# Patient Record
Sex: Female | Born: 1970 | Race: White | Hispanic: No | Marital: Married | Smoking: Current every day smoker
Health system: Southern US, Community
[De-identification: ages and names within clinical notes are randomized; demographics above are authoritative.]

## PROBLEM LIST (undated history)

## (undated) DIAGNOSIS — Z5189 Encounter for other specified aftercare: Secondary | ICD-10-CM

## (undated) DIAGNOSIS — D649 Anemia, unspecified: Secondary | ICD-10-CM

## (undated) DIAGNOSIS — E079 Disorder of thyroid, unspecified: Secondary | ICD-10-CM

---

## 2005-03-10 ENCOUNTER — Emergency Department: Payer: Self-pay | Admitting: Emergency Medicine

## 2005-07-10 ENCOUNTER — Emergency Department: Payer: Self-pay | Admitting: Emergency Medicine

## 2008-10-13 ENCOUNTER — Ambulatory Visit: Payer: Self-pay | Admitting: Family Medicine

## 2008-11-12 ENCOUNTER — Ambulatory Visit: Payer: Self-pay | Admitting: Urology

## 2008-11-21 ENCOUNTER — Emergency Department: Payer: Self-pay | Admitting: Emergency Medicine

## 2008-12-15 ENCOUNTER — Ambulatory Visit: Payer: Self-pay | Admitting: Obstetrics & Gynecology

## 2008-12-21 ENCOUNTER — Ambulatory Visit: Payer: Self-pay | Admitting: Obstetrics & Gynecology

## 2009-07-23 ENCOUNTER — Emergency Department: Payer: Self-pay | Admitting: Emergency Medicine

## 2010-06-17 ENCOUNTER — Emergency Department: Payer: Self-pay | Admitting: Emergency Medicine

## 2010-11-03 ENCOUNTER — Observation Stay: Payer: Self-pay | Admitting: Internal Medicine

## 2013-02-09 ENCOUNTER — Ambulatory Visit: Payer: Self-pay | Admitting: Family Medicine

## 2013-04-26 ENCOUNTER — Emergency Department: Payer: Self-pay | Admitting: Emergency Medicine

## 2013-04-26 LAB — CBC
HGB: 14.7 g/dL (ref 12.0–16.0)
MCHC: 34.9 g/dL (ref 32.0–36.0)
Platelet: 150 10*3/uL (ref 150–440)
RBC: 4.32 10*6/uL (ref 3.80–5.20)
WBC: 5.5 10*3/uL (ref 3.6–11.0)

## 2013-04-26 LAB — URINALYSIS, COMPLETE
Bacteria: NONE SEEN
Bilirubin,UR: NEGATIVE
Glucose,UR: NEGATIVE mg/dL (ref 0–75)
Ketone: NEGATIVE
Nitrite: NEGATIVE
Ph: 5 (ref 4.5–8.0)
RBC,UR: 4 /HPF (ref 0–5)
Specific Gravity: 1.017 (ref 1.003–1.030)

## 2013-04-26 LAB — COMPREHENSIVE METABOLIC PANEL
Alkaline Phosphatase: 65 U/L (ref 50–136)
Anion Gap: 7 (ref 7–16)
BUN: 15 mg/dL (ref 7–18)
Calcium, Total: 9.3 mg/dL (ref 8.5–10.1)
Co2: 27 mmol/L (ref 21–32)
Creatinine: 0.81 mg/dL (ref 0.60–1.30)
EGFR (African American): >60
EGFR (Non-African Amer.): >60
Glucose: 74 mg/dL (ref 65–99)
Osmolality: 277 (ref 275–301)
SGOT(AST): 10 U/L — ABNORMAL LOW (ref 15–37)
SGPT (ALT): 16 U/L (ref 12–78)
Total Protein: 7.4 g/dL (ref 6.4–8.2)

## 2013-04-26 LAB — TSH: Thyroid Stimulating Horm: 1.96 u[IU]/mL

## 2013-09-29 ENCOUNTER — Ambulatory Visit: Payer: Self-pay | Admitting: Family Medicine

## 2014-03-28 ENCOUNTER — Emergency Department: Payer: Self-pay | Admitting: Emergency Medicine

## 2014-03-28 LAB — COMPREHENSIVE METABOLIC PANEL
ALBUMIN: 4.4 g/dL (ref 3.4–5.0)
ANION GAP: 5 — AB (ref 7–16)
AST: 31 U/L (ref 15–37)
Alkaline Phosphatase: 70 U/L
BILIRUBIN TOTAL: 0.2 mg/dL (ref 0.2–1.0)
BUN: 15 mg/dL (ref 7–18)
CHLORIDE: 106 mmol/L (ref 98–107)
CO2: 27 mmol/L (ref 21–32)
Calcium, Total: 8.8 mg/dL (ref 8.5–10.1)
Creatinine: 0.97 mg/dL (ref 0.60–1.30)
EGFR (Non-African Amer.): >60
Glucose: 92 mg/dL (ref 65–99)
Osmolality: 276 (ref 275–301)
POTASSIUM: 3.5 mmol/L (ref 3.5–5.1)
SGPT (ALT): 19 U/L (ref 12–78)
SODIUM: 138 mmol/L (ref 136–145)
Total Protein: 8.1 g/dL (ref 6.4–8.2)

## 2014-03-28 LAB — CBC WITH DIFFERENTIAL/PLATELET
BASOS ABS: 0.1 10*3/uL (ref 0.0–0.1)
Basophil %: 0.9 %
Eosinophil #: 0.1 10*3/uL (ref 0.0–0.7)
Eosinophil %: 0.8 %
HCT: 43.7 % (ref 35.0–47.0)
HGB: 14.6 g/dL (ref 12.0–16.0)
LYMPHS ABS: 3.2 10*3/uL (ref 1.0–3.6)
Lymphocyte %: 35.7 %
MCH: 33.1 pg (ref 26.0–34.0)
MCHC: 33.3 g/dL (ref 32.0–36.0)
MCV: 99 fL (ref 80–100)
MONO ABS: 0.5 x10 3/mm (ref 0.2–0.9)
Monocyte %: 6 %
Neutrophil #: 5 10*3/uL (ref 1.4–6.5)
Neutrophil %: 56.6 %
Platelet: 169 10*3/uL (ref 150–440)
RBC: 4.41 10*6/uL (ref 3.80–5.20)
RDW: 12.7 % (ref 11.5–14.5)
WBC: 8.8 10*3/uL (ref 3.6–11.0)

## 2014-03-28 LAB — LIPASE, BLOOD: Lipase: 152 U/L (ref 73–393)

## 2014-03-31 ENCOUNTER — Emergency Department: Payer: Self-pay | Admitting: Emergency Medicine

## 2014-03-31 LAB — CBC WITH DIFFERENTIAL/PLATELET
BASOS ABS: 0.1 10*3/uL (ref 0.0–0.1)
Basophil %: 1.2 %
EOS ABS: 0.1 10*3/uL (ref 0.0–0.7)
Eosinophil %: 1.2 %
HCT: 37.9 % (ref 35.0–47.0)
HGB: 12.9 g/dL (ref 12.0–16.0)
LYMPHS ABS: 2.2 10*3/uL (ref 1.0–3.6)
LYMPHS PCT: 39.6 %
MCH: 34 pg (ref 26.0–34.0)
MCHC: 34.1 g/dL (ref 32.0–36.0)
MCV: 100 fL (ref 80–100)
MONOS PCT: 7.1 %
Monocyte #: 0.4 x10 3/mm (ref 0.2–0.9)
NEUTROS ABS: 2.8 10*3/uL (ref 1.4–6.5)
Neutrophil %: 50.9 %
Platelet: 155 10*3/uL (ref 150–440)
RBC: 3.8 10*6/uL (ref 3.80–5.20)
RDW: 12.8 % (ref 11.5–14.5)
WBC: 5.5 10*3/uL (ref 3.6–11.0)

## 2014-03-31 LAB — COMPREHENSIVE METABOLIC PANEL
ALK PHOS: 65 U/L
ALT: 17 U/L (ref 12–78)
ANION GAP: 6 — AB (ref 7–16)
Albumin: 3.9 g/dL (ref 3.4–5.0)
BUN: 11 mg/dL (ref 7–18)
Bilirubin,Total: 0.2 mg/dL (ref 0.2–1.0)
CHLORIDE: 108 mmol/L — AB (ref 98–107)
CREATININE: 0.75 mg/dL (ref 0.60–1.30)
Calcium, Total: 9 mg/dL (ref 8.5–10.1)
Co2: 25 mmol/L (ref 21–32)
EGFR (African American): >60
EGFR (Non-African Amer.): >60
Glucose: 73 mg/dL (ref 65–99)
Osmolality: 276 (ref 275–301)
POTASSIUM: 3.9 mmol/L (ref 3.5–5.1)
SGOT(AST): 16 U/L (ref 15–37)
Sodium: 139 mmol/L (ref 136–145)
Total Protein: 7.3 g/dL (ref 6.4–8.2)

## 2014-03-31 LAB — URINALYSIS, COMPLETE
BILIRUBIN, UR: NEGATIVE
Glucose,UR: NEGATIVE mg/dL (ref 0–75)
Ketone: NEGATIVE
Nitrite: NEGATIVE
Ph: 6 (ref 4.5–8.0)
Protein: 30
RBC,UR: 1496 /HPF (ref 0–5)
Specific Gravity: 1.016 (ref 1.003–1.030)
WBC UR: 22 /HPF (ref 0–5)

## 2014-03-31 LAB — LIPASE, BLOOD: Lipase: 162 U/L (ref 73–393)

## 2014-03-31 LAB — PREGNANCY, URINE: Pregnancy Test, Urine: NEGATIVE m[IU]/mL

## 2014-04-02 LAB — URINE CULTURE

## 2014-11-21 IMAGING — CR DG CHEST 2V
1 series · 2 of 2 positions shown · non-contrast
Comparison: none

REASON FOR EXAM: chest pain
COMMENTS:

PROCEDURE:     MDR - MDR CHEST PA(OR AP) AND LATERAL  - February 09, 2013 [DATE]
RESULT:     Comparison: 11/03/2010

[Series 1: pa · 0.17mm/px · 2 of 2 slices shown]
[im 1/2]
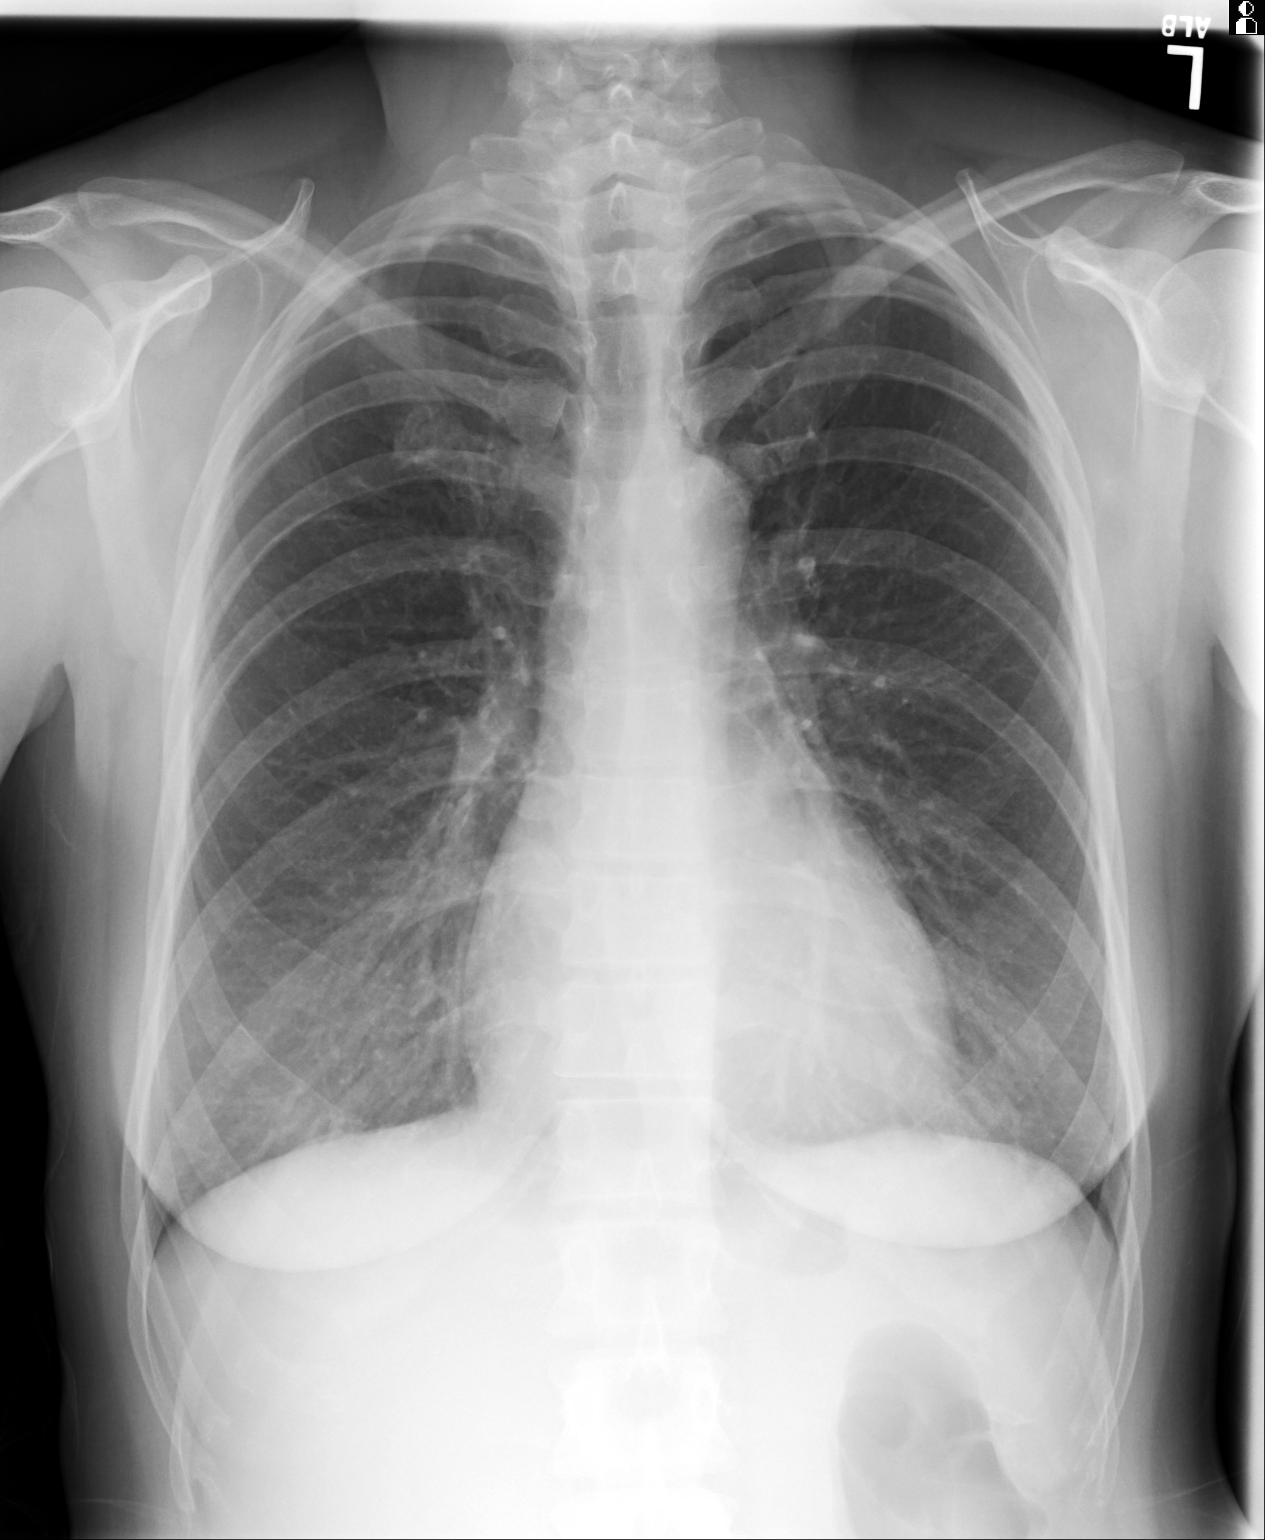
[im 2/2]
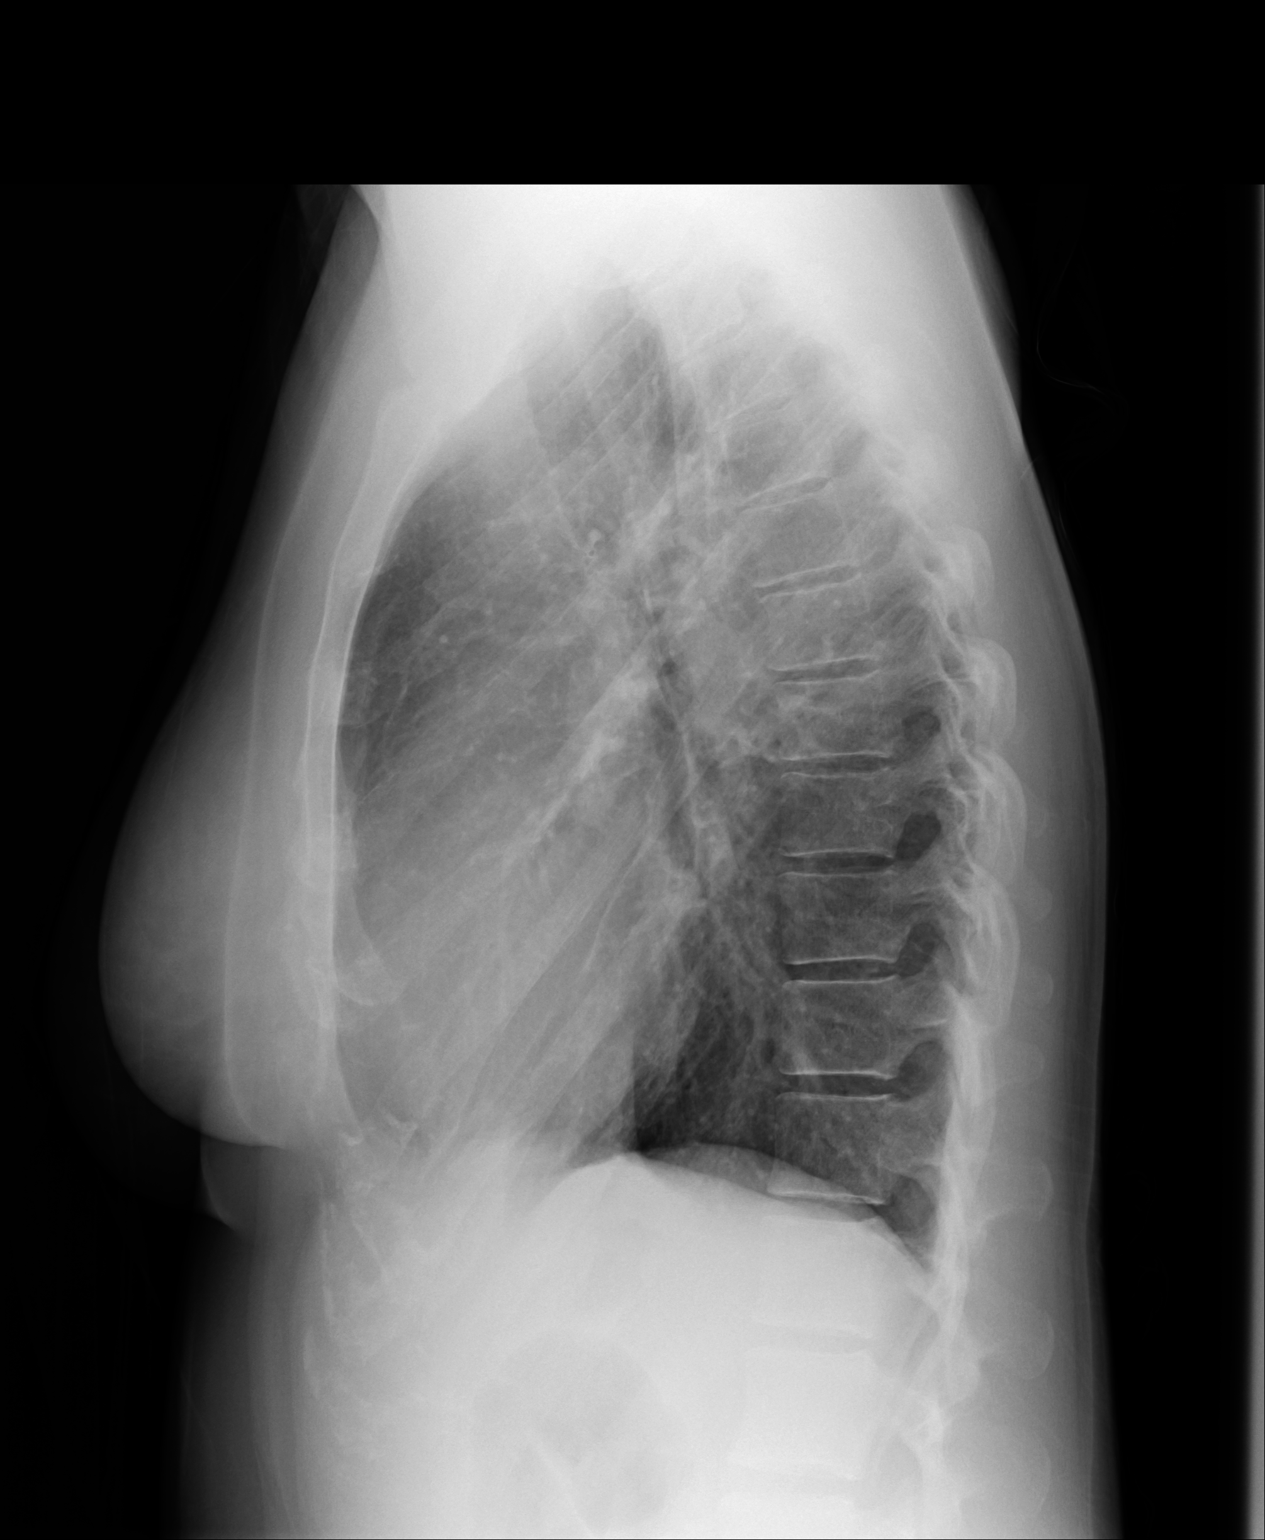

[2 of 2 positions shown; findings below may reference images not displayed]

FINDINGS: The heart and mediastinum are within normal limits. Minimal basilar
reticular opacities are likely secondary to atelectasis.
IMPRESSION: Minimal basilar reticular opacities are likely secondary to atelectasis.
Otherwise, no acute cardiopulmonary disease.

[REDACTED]

## 2014-11-21 IMAGING — CT CT STONE STUDY
1 of 2 series · 14 of 32 positions shown, 18 images · non-contrast
Comparison: none

REASON FOR EXAM: CALL REPORT [phone] Extreme back pain Chest pain
Hematuria Eval for kid...
COMMENTS:

[Series 2: soft tissue · axial · 0.68mm/px · z∈[-788,-425]mm · 14 of 138 slices shown, 18 images]
[im 11/138  soft-tissue]
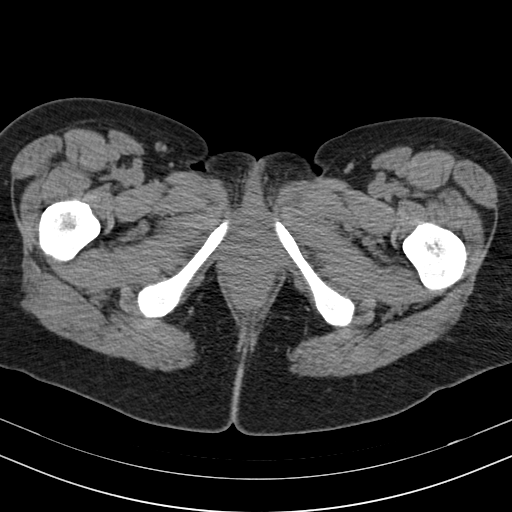
[im 11/138  bone]
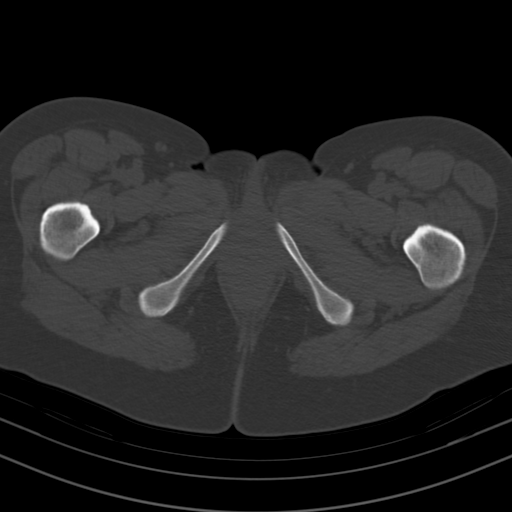
[im 22/138  soft-tissue]
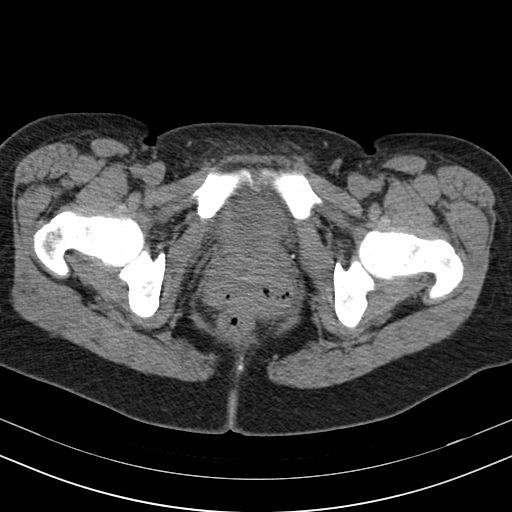
[im 32/138  soft-tissue]
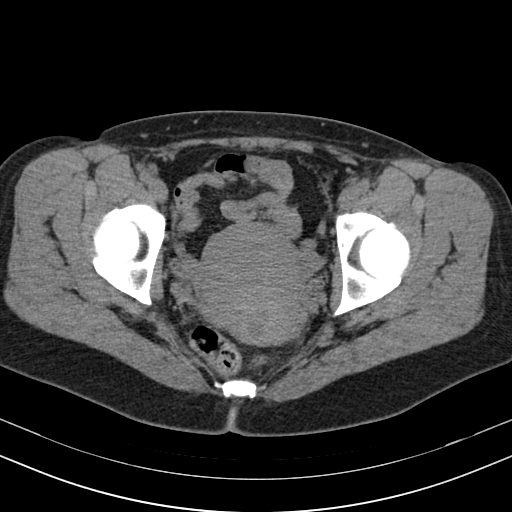
[im 43/138  soft-tissue]
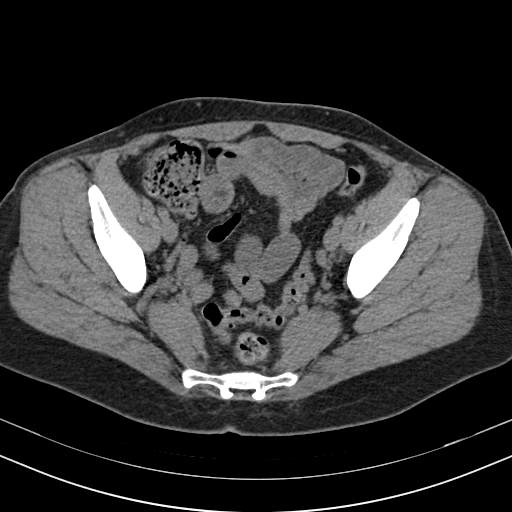
[im 53/138  soft-tissue]
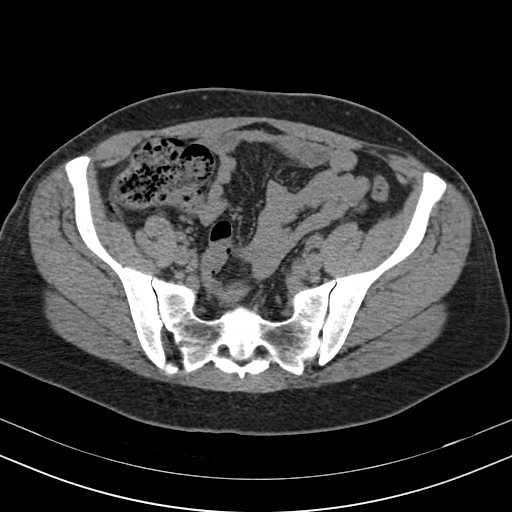
[im 64/138  soft-tissue]
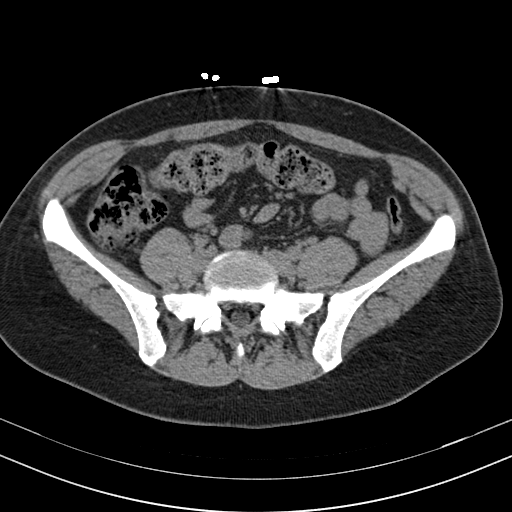
[im 74/138  soft-tissue]
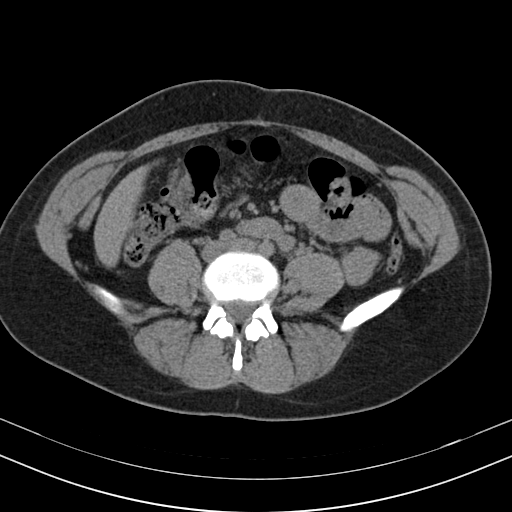
[im 85/138  soft-tissue]
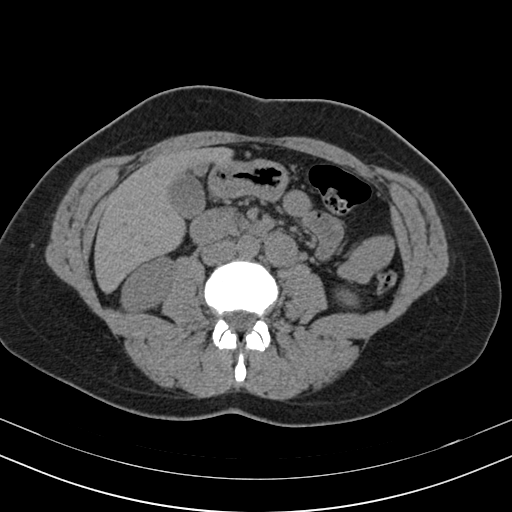
[im 95/138  soft-tissue]
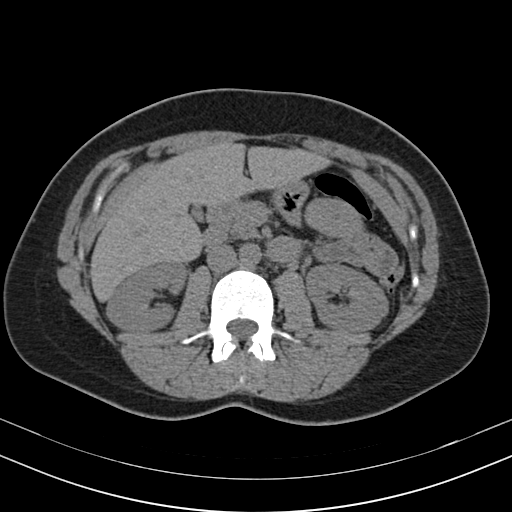
[im 95/138  bone]
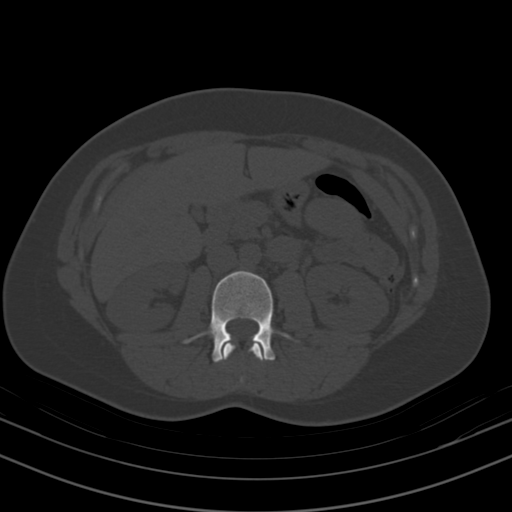
[im 106/138  soft-tissue]
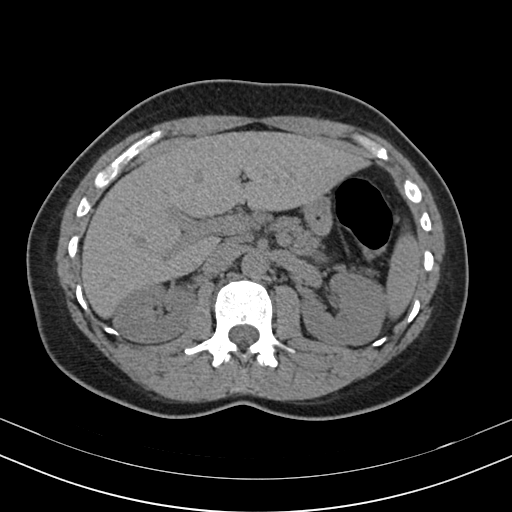
[im 116/138  soft-tissue]
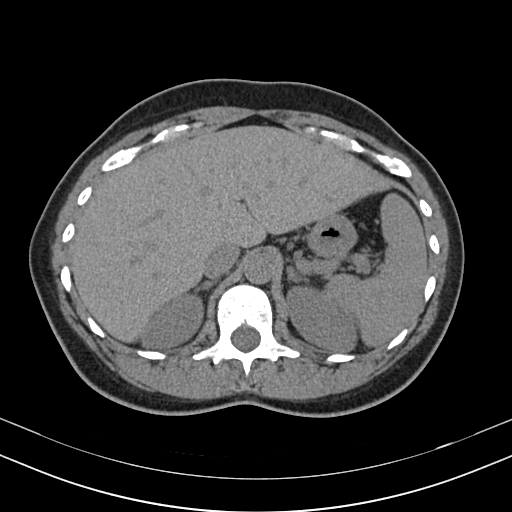
[im 116/138  lung]
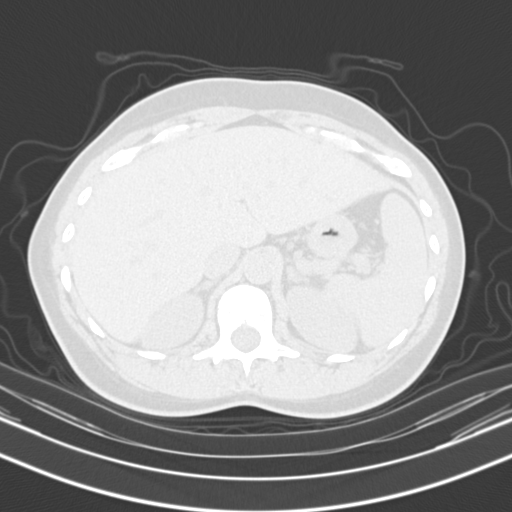
[im 122/138  lung]
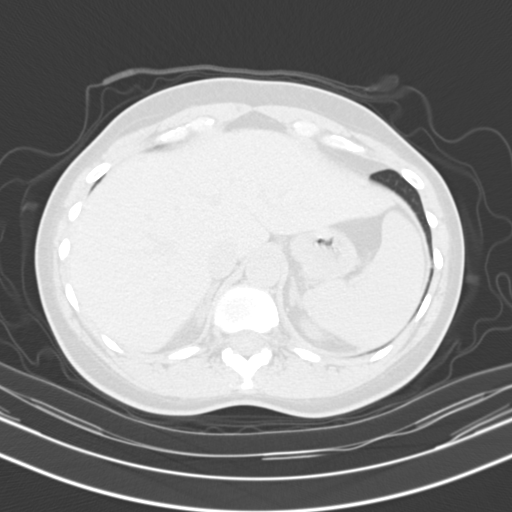
[im 127/138  soft-tissue]
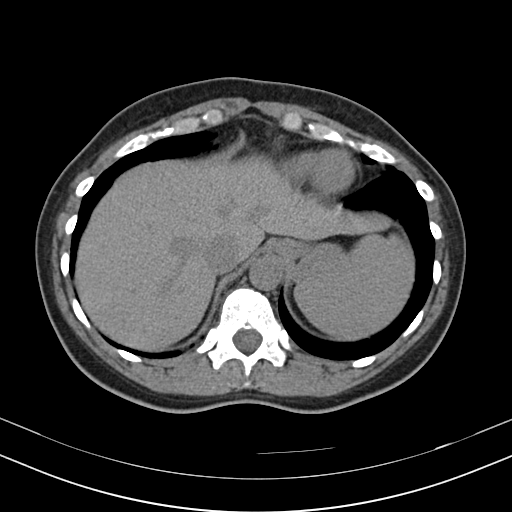
[im 127/138  lung]
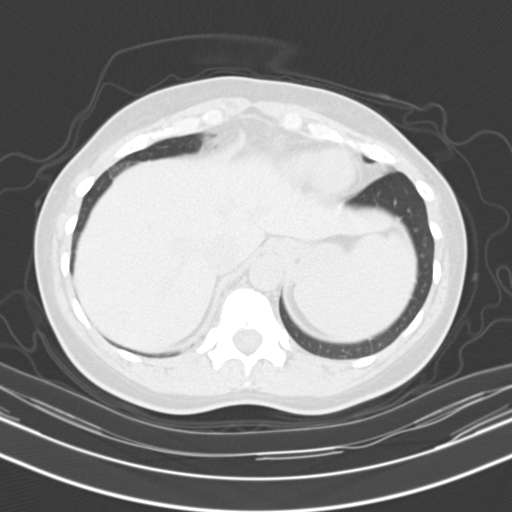
[im 132/138  lung]
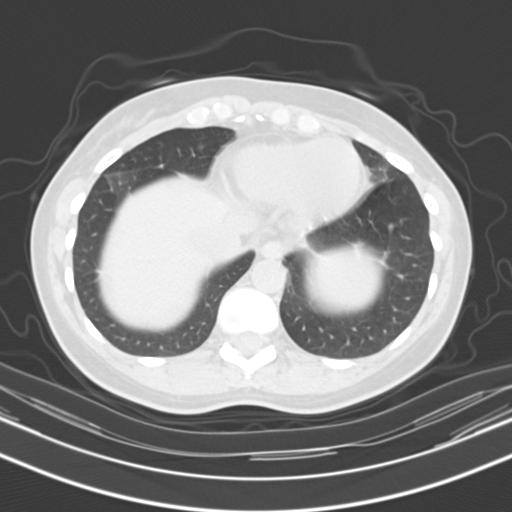

[14 of 32 positions shown; findings below may reference images not displayed]

PROCEDURE:     SNYRIT - SNYRIT ABDOMEN/PELVIS WO ( STONE)  - February 09, 2013 [DATE]

RESULT:     Axial noncontrast CT scanning was performed through the abdomen
and pelvis with reconstructions at 3 mm intervals and slice thicknesses.
Review of multiplanar reconstructed images was performed separately on the
VIA monitor.

The kidneys are normal in contour. There is no hydronephrosis. There are
faint medullary calcifications demonstrated bilaterally, but there is only
one discrete stone demonstrated. This lies in the midpole of the left kidney
on image 37 and measures 1 mm in diameter. The perinephric fat is normal in
density. Along the expected course of the ureters there is no hydroureter.
There is a 4mm calcification which may lie at the ureterovesical junction on
the left on image 116 and could reflect a nonobstructing stone. The
partially distended urinary bladder is normal in appearance.

The uterus demonstrates an approximately 12 mm diameter low density
posteriorly most compatible with a fibroid. There are no adnexal masses.
There is no free pelvic fluid. The unopacified loops of small and large
bowel exhibit no evidence of ileus or obstruction. A normal calibered
uninflamed appearing appendix is demonstrated.

The liver exhibits no focal mass or ductal dilation. The gallbladder is
adequately distended with no evidence of stones. The pancreas, spleen,
adrenal glands, nondistended stomach, and abdominal aorta are normal in
appearance. The periaortic and pericaval regions are normal in appearance.

The lung bases are clear. The lumbar vertebral bodies are preserved in
height.
IMPRESSION: 1. There is no evidence of hydronephrosis. However, there is a 4 mm diameter
calcification which may lie in the distal left ureter just proximal to the
ureterovesical junction. There is no evidence of perinephric inflammatory
change.
2. There is no acute hepatobiliary abnormality nor acute bowel abnormality.
3. There is likely a fibroid in the posterior aspect of the uterine corpus.
This report was called to Dr. Gwinn at [DATE] on 09 February, 2013.

[REDACTED]

## 2014-12-09 ENCOUNTER — Emergency Department: Payer: Self-pay | Admitting: Emergency Medicine

## 2014-12-09 LAB — BASIC METABOLIC PANEL
ANION GAP: 6 — AB (ref 7–16)
BUN: 15 mg/dL (ref 7–18)
CALCIUM: 8.4 mg/dL — AB (ref 8.5–10.1)
CO2: 27 mmol/L (ref 21–32)
CREATININE: 0.8 mg/dL (ref 0.60–1.30)
Chloride: 108 mmol/L — ABNORMAL HIGH (ref 98–107)
Glucose: 90 mg/dL (ref 65–99)
Osmolality: 282 (ref 275–301)
Potassium: 3.6 mmol/L (ref 3.5–5.1)
Sodium: 141 mmol/L (ref 136–145)

## 2014-12-09 LAB — CBC
HCT: 43.2 % (ref 35.0–47.0)
HGB: 14.8 g/dL (ref 12.0–16.0)
MCH: 33.8 pg (ref 26.0–34.0)
MCHC: 34.2 g/dL (ref 32.0–36.0)
MCV: 99 fL (ref 80–100)
PLATELETS: 132 10*3/uL — AB (ref 150–440)
RBC: 4.37 10*6/uL (ref 3.80–5.20)
RDW: 12.9 % (ref 11.5–14.5)
WBC: 6.9 10*3/uL (ref 3.6–11.0)

## 2014-12-09 LAB — TROPONIN I: Troponin-I: <0.02 ng/mL

## 2015-09-27 ENCOUNTER — Other Ambulatory Visit: Payer: Self-pay | Admitting: Family Medicine

## 2015-09-27 DIAGNOSIS — R221 Localized swelling, mass and lump, neck: Secondary | ICD-10-CM

## 2015-10-03 ENCOUNTER — Ambulatory Visit
Admission: RE | Admit: 2015-10-03 | Discharge: 2015-10-03 | Disposition: A | Discharge status: Home / Self Care | Payer: PRIVATE HEALTH INSURANCE | Source: Ambulatory Visit | Attending: Family Medicine | Admitting: Family Medicine

## 2015-10-03 DIAGNOSIS — E041 Nontoxic single thyroid nodule: Secondary | ICD-10-CM | POA: Insufficient documentation

## 2015-10-03 DIAGNOSIS — R221 Localized swelling, mass and lump, neck: Secondary | ICD-10-CM

## 2017-06-30 ENCOUNTER — Emergency Department: Payer: Self-pay

## 2017-06-30 ENCOUNTER — Encounter: Payer: Self-pay | Admitting: Emergency Medicine

## 2017-06-30 ENCOUNTER — Emergency Department
Admission: EM | Admit: 2017-06-30 | Discharge: 2017-06-30 | Disposition: A | Discharge status: Home / Self Care | Payer: Self-pay | Attending: Emergency Medicine | Admitting: Emergency Medicine

## 2017-06-30 DIAGNOSIS — Z79899 Other long term (current) drug therapy: Secondary | ICD-10-CM | POA: Insufficient documentation

## 2017-06-30 DIAGNOSIS — M79604 Pain in right leg: Secondary | ICD-10-CM

## 2017-06-30 DIAGNOSIS — M5431 Sciatica, right side: Secondary | ICD-10-CM | POA: Insufficient documentation

## 2017-06-30 DIAGNOSIS — N3000 Acute cystitis without hematuria: Secondary | ICD-10-CM | POA: Insufficient documentation

## 2017-06-30 DIAGNOSIS — F17200 Nicotine dependence, unspecified, uncomplicated: Secondary | ICD-10-CM | POA: Insufficient documentation

## 2017-06-30 HISTORY — DX: Anemia, unspecified: D64.9

## 2017-06-30 HISTORY — DX: Disorder of thyroid, unspecified: E07.9

## 2017-06-30 HISTORY — DX: Encounter for other specified aftercare: Z51.89

## 2017-06-30 LAB — URINALYSIS, ROUTINE W REFLEX MICROSCOPIC
Bacteria, UA: NONE SEEN
Bilirubin Urine: NEGATIVE
GLUCOSE, UA: NEGATIVE mg/dL
Ketones, ur: 5 mg/dL — AB
NITRITE: NEGATIVE
Protein, ur: 30 mg/dL — AB
SPECIFIC GRAVITY, URINE: 1.029 (ref 1.005–1.030)
pH: 5 (ref 5.0–8.0)

## 2017-06-30 LAB — POCT PREGNANCY, URINE: Preg Test, Ur: NEGATIVE

## 2017-06-30 MED ORDER — CYCLOBENZAPRINE HCL 5 MG PO TABS: 5.0000 mg | ORAL_TABLET | Freq: Three times a day (TID) | ORAL | 0 refills | Status: DC | PRN

## 2017-06-30 MED ORDER — TRAMADOL HCL 50 MG PO TABS: 50.0000 mg | ORAL_TABLET | Freq: Two times a day (BID) | ORAL | 0 refills | Status: DC

## 2017-06-30 MED ORDER — KETOROLAC TROMETHAMINE 60 MG/2ML IM SOLN
30.0000 mg | Freq: Once | INTRAMUSCULAR | Status: AC
  Administered 2017-06-30: 30 mg via INTRAMUSCULAR
  Filled 2017-06-30: qty 2

## 2017-06-30 MED ORDER — SULFAMETHOXAZOLE-TRIMETHOPRIM 800-160 MG PO TABS
1.0000 | ORAL_TABLET | Freq: Once | ORAL | Status: AC
  Administered 2017-06-30: 1 via ORAL
  Filled 2017-06-30: qty 1

## 2017-06-30 MED ORDER — SULFAMETHOXAZOLE-TRIMETHOPRIM 800-160 MG PO TABS: 1.0000 | ORAL_TABLET | Freq: Two times a day (BID) | ORAL | 0 refills | Status: AC

## 2017-06-30 MED ORDER — PREDNISONE 10 MG PO TABS: 10.0000 mg | ORAL_TABLET | Freq: Two times a day (BID) | ORAL | 0 refills | Status: DC

## 2017-06-30 NOTE — Discharge Instructions (Signed)
Your exam, x-ray, and ultrasound are essentially normal. No DVT (blood clot), ruptured disc, or severe degenerative changes are seen. Take the antibiotic and steroid as directed. Take the pain medicine and muscle relaxant as needed. Follow-up with Dr. Hyacinth Meeker for continued symptoms. Return to the ED as needed.

## 2017-06-30 NOTE — ED Provider Notes (Signed)
[facility] Emergency Department Provider Note ____________________________________________  Time seen: 1140  I have reviewed the triage vital signs and the nursing notes.  HISTORY  Chief Complaint  Leg Pain  HPI Jennifer Gomez is a 46 y.o. female presents to the ED for evaluation of right calf pain for the last month. She denies any recent injury, accident, or trauma. She denies any shortness of breath, chest pain, anxiety, skin temp/color changes, or foot drop. She has tried OTC medicine and ice without relief. She noted and sharp increase in the "cramp" pain to her right calf last night after she pivoted on a planted foot. She denies any bruising or disability. She has a secondary complaint of dysuria. She denies fevers, chills, sweats, or hematuria.   Past Medical History:  Diagnosis Date  . Anemia   . Blood transfusion without reported diagnosis   . Thyroid disease    There are no active problems to display for this patient.  History reviewed. No pertinent surgical history.  Prior to Admission medications   Medication Sig Start Date End Date Taking? Authorizing Provider  levothyroxine (SYNTHROID, LEVOTHROID) 50 MCG tablet Take 50 mcg by mouth daily before breakfast.   Yes [provider]  cyclobenzaprine (FLEXERIL) 5 MG tablet Take 1 tablet (5 mg total) by mouth 3 (three) times daily as needed for muscle spasms. 06/30/17   Jacci Ruberg, Charlesetta Ivory, PA-C  predniSONE (DELTASONE) 10 MG tablet Take 1 tablet (10 mg total) by mouth 2 (two) times daily with a meal. 06/30/17   Amine Adelson, Charlesetta Ivory, PA-C  sulfamethoxazole-trimethoprim (BACTRIM DS,SEPTRA DS) 800-160 MG tablet Take 1 tablet by mouth 2 (two) times daily. 06/30/17 07/05/17  Loye Vento, Charlesetta Ivory, PA-C  traMADol (ULTRAM) 50 MG tablet Take 1 tablet (50 mg total) by mouth 2 (two) times daily. 06/30/17   Matis Monnier, Charlesetta Ivory, PA-C   Allergies Patient has no known allergies.  No family history  on file.  Social History Social History  Substance Use Topics  . Smoking status: Current Every Day Smoker  . Smokeless tobacco: Never Used  . Alcohol use No    Review of Systems  Constitutional: Negative for fever. Cardiovascular: Negative for chest pain. Respiratory: Negative for shortness of breath. Gastrointestinal: Negative for abdominal pain, vomiting and diarrhea. Genitourinary: Positive for dysuria and frequency. Musculoskeletal: Negative for back pain. Right calf pain as above Skin: Negative for rash. Neurological: Negative for headaches, focal weakness or numbness. ____________________________________________  PHYSICAL EXAM:  VITAL SIGNS: ED Triage Vitals  Enc Vitals Group     BP 06/30/17 1113 136/89     Pulse Rate 06/30/17 1113 82     Resp 06/30/17 1113 16     Temp 06/30/17 1113 98.1 F (36.7 C)     Temp Source 06/30/17 1113 Oral     SpO2 06/30/17 1113 98 %     Weight 06/30/17 1115 140 lb (63.5 kg)     Height 06/30/17 1115 5\' 5"  (1.651 m)     Head Circumference --      Peak Flow --      Pain Score 06/30/17 1113 6     Pain Loc --      Pain Edu? --      Excl. in GC? --    Constitutional: Alert and oriented. Well appearing and in no distress. Head: Normocephalic and atraumatic. Cardiovascular: Normal rate, regular rhythm. Normal distal pulses. Respiratory: Normal respiratory effort. No wheezes/rales/rhonchi. Gastrointestinal: Soft and nontender. No distention,  rebound, guarding, rigidity, organomegaly. No CVA tenderness. Musculoskeletal: Normal spinal alignment without midline tenderness, spasm, deformity, or step-off. Right calf without obvious edema, erythema, muscle defect, or palpable cords. Tenderness to calf muscle compression. Negative Thompson test.Benign ankle exam. Nontender with normal range of motion in all extremities.  Neurologic:  Antalgic gait without ataxia. Normal speech and language. No gross focal neurologic deficits are appreciated. Skin:   Skin is warm, dry and intact. No rash noted. ____________________________________________   LABS (pertinent positives/negatives) Labs Reviewed  URINALYSIS, ROUTINE W REFLEX MICROSCOPIC - Abnormal; Notable for the following:       Result Value   Color, Urine AMBER (*)    APPearance CLOUDY (*)    Hgb urine dipstick SMALL (*)    Ketones, ur 5 (*)    Protein, ur 30 (*)    Leukocytes, UA LARGE (*)    Squamous Epithelial / LPF 6-30 (*)    All other components within normal limits  URINE CULTURE  POC URINE PREG, ED  POCT PREGNANCY, URINE  ____________________________________________   RADIOLOGY  RLE Ultrasound/Doppler  IMPRESSION: No evidence of DVT within the right lower extremity.  Lumbar Spine Complete  IMPRESSION: No acute abnormality noted. ____________________________________________  PROCEDURES  Bactrim DS 1 PO Toradol 30 mg IM ____________________________________________  INITIAL IMPRESSION / ASSESSMENT AND PLAN / ED COURSE  Patient with ED evaluation of RLE pain and cramping, without radiologic evidence of DVT. She is also reassured by her plain films. She is discharged with prescriptions for prednisone, cyclobenzaprine, tramadol, and Bactrim DS. She will follow-up with ortho for continued symptoms. Her symptoms may represent sciatica or muscle strain to the lower leg. She will return as needed.  ____________________________________________  FINAL CLINICAL IMPRESSION(S) / ED DIAGNOSES  Final diagnoses:  Sciatica of right side  Acute cystitis without hematuria  Right leg pain      Merritt Mccravy, Charlesetta Ivory, PA-C 06/30/17 1620    Arnaldo Natal, MD 06/30/17 1623

## 2017-06-30 NOTE — ED Notes (Addendum)
Pt ambulated to room 52 with slight limp.

## 2017-06-30 NOTE — ED Notes (Signed)
See triage note  States she is having pain to right calf area for the past month unsure of injury  States pain became worse yesterday and is moving into posterior heel  Ambulates with limp d/t pain  Also having some dysuria

## 2017-06-30 NOTE — ED Triage Notes (Signed)
Pt states that she has been having pain in her right calf for the past month. Pt states that she had a "cramp" in her calf about a month ago and has been having pain since that time. Pt states that she has tried ice and OTC medication without relief. No redness or swelling noted. Pt also states that she is having burning with urination. Pt does not appear to be in distress at this time.

## 2017-07-03 LAB — URINE CULTURE
Culture: >=100000 — AB
Special Requests: NORMAL

## 2017-11-05 DIAGNOSIS — N200 Calculus of kidney: Secondary | ICD-10-CM

## 2017-11-05 HISTORY — DX: Calculus of kidney: N20.0

## 2018-04-21 ENCOUNTER — Emergency Department
Admission: EM | Admit: 2018-04-21 | Discharge: 2018-04-21 | Disposition: A | Discharge status: Home / Self Care | Payer: PRIVATE HEALTH INSURANCE | Attending: Emergency Medicine | Admitting: Emergency Medicine

## 2018-04-21 ENCOUNTER — Encounter: Payer: Self-pay | Admitting: Emergency Medicine

## 2018-04-21 DIAGNOSIS — Z79899 Other long term (current) drug therapy: Secondary | ICD-10-CM | POA: Insufficient documentation

## 2018-04-21 DIAGNOSIS — F17200 Nicotine dependence, unspecified, uncomplicated: Secondary | ICD-10-CM | POA: Insufficient documentation

## 2018-04-21 DIAGNOSIS — J0100 Acute maxillary sinusitis, unspecified: Secondary | ICD-10-CM | POA: Insufficient documentation

## 2018-04-21 LAB — GROUP A STREP BY PCR: Group A Strep by PCR: NOT DETECTED

## 2018-04-21 MED ORDER — AMOXICILLIN-POT CLAVULANATE 875-125 MG PO TABS: 1.0000 | ORAL_TABLET | Freq: Two times a day (BID) | ORAL | 0 refills | Status: AC

## 2018-04-21 MED ORDER — PREDNISONE 10 MG PO TABS: ORAL_TABLET | ORAL | 0 refills | Status: DC

## 2018-04-21 NOTE — ED Provider Notes (Signed)
[facility] Emergency Department Provider Note  ____________________________________________   First MD Initiated Contact with Patient 04/21/18 [phone]     (approximate)  I have reviewed the triage vital signs and the nursing notes.   HISTORY  Chief Complaint Sore Throat   HPI Jennifer Gomez is a 47 y.o. female is here with complaint of sore throat and bilateral ear pain for 1 month.  Patient denies any fever or chills.  Patient has been taking over-the-counter Robitussin-DM without any relief.  She denies any fever or chills.  She states she has had sinus infections in the past and does endorse some facial pain with this.  Rates her pain as a 4 out of 10.  Past Medical History:  Diagnosis Date  . Anemia   . Blood transfusion without reported diagnosis   . Thyroid disease     There are no active problems to display for this patient.   History reviewed. No pertinent surgical history.  Prior to Admission medications   Medication Sig Start Date End Date Taking? Authorizing Provider  amoxicillin-clavulanate (AUGMENTIN) 875-125 MG tablet Take 1 tablet by mouth 2 (two) times daily for 10 days. 04/21/18 05/01/18  Tommi Rumps, PA-C  levothyroxine (SYNTHROID, LEVOTHROID) 50 MCG tablet Take 50 mcg by mouth daily before breakfast.    [provider]  predniSONE (DELTASONE) 10 MG tablet Take 6 tablets  today, on day 2 take 5 tablets, day 3 take 4 tablets, day 4 take 3 tablets, day 5 take  2 tablets and 1 tablet the last day 04/21/18   Tommi Rumps, PA-C    Allergies Patient has no known allergies.  No family history on file.  Social History Social History   Tobacco Use  . Smoking status: Current Every Day Smoker  . Smokeless tobacco: Never Used  Substance Use Topics  . Alcohol use: No  . Drug use: No    Review of Systems Constitutional: No fever/chills Eyes: No visual changes. ENT: Positive for sinus drainage. Cardiovascular:  Denies chest pain. Respiratory: Denies shortness of breath. Gastrointestinal:  No nausea, no vomiting. Musculoskeletal: Negative for back pain. Neurological: Negative for headaches. ____________________________________________   PHYSICAL EXAM:  VITAL SIGNS: ED Triage Vitals  Enc Vitals Group     BP 04/21/18 0611 (!) 151/84     Pulse Rate 04/21/18 0611 99     Resp 04/21/18 0611 18     Temp 04/21/18 0611 98.1 F (36.7 C)     Temp Source 04/21/18 0611 Oral     SpO2 04/21/18 0611 97 %     Weight 04/21/18 0608 150 lb (68 kg)     Height 04/21/18 0608 5\' 4"  (1.626 m)     Head Circumference --      Peak Flow --      Pain Score 04/21/18 0608 4     Pain Loc --      Pain Edu? --      Excl. in GC? --     Constitutional: Alert and oriented. Well appearing and in no acute distress. Eyes: Conjunctivae are normal. PERRL. EOMI. Head: Atraumatic. Nose: Mild congestion/rhinnorhea.  Maxillary sinuses tender to percussion bilaterally.  TMs are dull bilaterally with poor light reflex but no erythema or injection noted. Mouth/Throat: Mucous membranes are moist.  Oropharynx non-erythematous.  Posterior drainage present. Neck: No stridor.   Hematological/Lymphatic/Immunilogical: No cervical lymphadenopathy. Cardiovascular: Normal rate, regular rhythm. Grossly normal heart sounds.  Good peripheral circulation. Respiratory: Normal respiratory effort.  No retractions. Lungs CTAB. Musculoskeletal: Moves upper and lower extremities without any difficulty.  Normal gait was noted. Neurologic:  Normal speech and language. No gross focal neurologic deficits are appreciated. No gait instability. Skin:  Skin is warm, dry and intact. No rash noted. Psychiatric: Mood and affect are normal. Speech and behavior are normal.  ____________________________________________   LABS (all labs ordered are listed, but only abnormal results are displayed)  Labs Reviewed  GROUP A STREP BY PCR     PROCEDURES  Procedure(s) performed: None  Procedures  Critical Care performed: No  ____________________________________________   INITIAL IMPRESSION / ASSESSMENT AND PLAN / ED COURSE  As part of my medical decision making, I reviewed the following data within the electronic MEDICAL RECORD NUMBER Notes from prior ED visits and [place] Controlled Substance Database  Patient is to follow-up with Dr. Elenore RotaJuengel if any continued problems with her sinuses.  She was placed on prednisone 60 mg 6-day taper along with Augmentin 875 twice daily for 10 days.  Patient is encouraged to take Tylenol or ibuprofen if needed for facial pain.  Increase fluids.  ____________________________________________   FINAL CLINICAL IMPRESSION(S) / ED DIAGNOSES  Final diagnoses:  Acute maxillary sinusitis, recurrence not specified     ED Discharge Orders        Ordered    predniSONE (DELTASONE) 10 MG tablet     04/21/18 0736    amoxicillin-clavulanate (AUGMENTIN) 875-125 MG tablet  2 times daily     04/21/18 0736       Note:  This document was prepared using Dragon voice recognition software and may include unintentional dictation errors.    Tommi RumpsSummers, Rhonda L, PA-C 04/21/18 1120    Schaevitz, Myra Rudeavid Matthew, MD 04/21/18 [phone]

## 2018-04-21 NOTE — Discharge Instructions (Addendum)
Begin taking steroids today and for the next 6 days as directed.  Augmentin 875 twice daily for 10 days.  Tylenol or ibuprofen as needed for sinus pain.  Increase fluids.  Call make an appointment with [facility] if any continued problems or not improving.  The name of the specialist is listed on your discharge papers along with phone number for you to contact.

## 2018-04-21 NOTE — ED Notes (Signed)
See triage note  Presents with possible sinus infection  Had sinus pressure and pressure to both ears  Denies any fever  resp even and non labored

## 2018-04-21 NOTE — ED Triage Notes (Addendum)
Patient with complaint of cold symptoms with sore throat and bilateral ear pain times one month.

## 2019-11-24 ENCOUNTER — Encounter: Payer: Self-pay | Admitting: Emergency Medicine

## 2019-11-24 ENCOUNTER — Emergency Department: Payer: Self-pay

## 2019-11-24 ENCOUNTER — Other Ambulatory Visit: Payer: Self-pay

## 2019-11-24 ENCOUNTER — Emergency Department
Admission: EM | Admit: 2019-11-24 | Discharge: 2019-11-24 | Disposition: A | Discharge status: Home / Self Care | Payer: Self-pay | Attending: Emergency Medicine | Admitting: Emergency Medicine

## 2019-11-24 DIAGNOSIS — R0602 Shortness of breath: Secondary | ICD-10-CM | POA: Insufficient documentation

## 2019-11-24 DIAGNOSIS — F17200 Nicotine dependence, unspecified, uncomplicated: Secondary | ICD-10-CM | POA: Insufficient documentation

## 2019-11-24 DIAGNOSIS — Z79899 Other long term (current) drug therapy: Secondary | ICD-10-CM | POA: Insufficient documentation

## 2019-11-24 DIAGNOSIS — R079 Chest pain, unspecified: Secondary | ICD-10-CM | POA: Insufficient documentation

## 2019-11-24 DIAGNOSIS — Z20822 Contact with and (suspected) exposure to covid-19: Secondary | ICD-10-CM | POA: Insufficient documentation

## 2019-11-24 LAB — CBC WITH DIFFERENTIAL/PLATELET
Abs Immature Granulocytes: 0.01 10*3/uL (ref 0.00–0.07)
Basophils Absolute: 0.1 10*3/uL (ref 0.0–0.1)
Basophils Relative: 1 %
Eosinophils Absolute: 0.1 10*3/uL (ref 0.0–0.5)
Eosinophils Relative: 1 %
HCT: 46.8 % — ABNORMAL HIGH (ref 36.0–46.0)
Hemoglobin: 15.7 g/dL — ABNORMAL HIGH (ref 12.0–15.0)
Immature Granulocytes: 0 %
Lymphocytes Relative: 28 %
Lymphs Abs: 2 10*3/uL (ref 0.7–4.0)
MCH: 32 pg (ref 26.0–34.0)
MCHC: 33.5 g/dL (ref 30.0–36.0)
MCV: 95.5 fL (ref 80.0–100.0)
Monocytes Absolute: 0.4 10*3/uL (ref 0.1–1.0)
Monocytes Relative: 6 %
Neutro Abs: 4.6 10*3/uL (ref 1.7–7.7)
Neutrophils Relative %: 64 %
Platelets: 201 10*3/uL (ref 150–400)
RBC: 4.9 MIL/uL (ref 3.87–5.11)
RDW: 14.3 % (ref 11.5–15.5)
WBC: 7.2 10*3/uL (ref 4.0–10.5)
nRBC: 0 % (ref 0.0–0.2)

## 2019-11-24 LAB — COMPREHENSIVE METABOLIC PANEL
ALT: 14 U/L (ref 0–44)
AST: 20 U/L (ref 15–41)
Albumin: 4 g/dL (ref 3.5–5.0)
Alkaline Phosphatase: 71 U/L (ref 38–126)
Anion gap: 9 (ref 5–15)
BUN: 23 mg/dL — ABNORMAL HIGH (ref 6–20)
CO2: 27 mmol/L (ref 22–32)
Calcium: 9 mg/dL (ref 8.9–10.3)
Chloride: 105 mmol/L (ref 98–111)
Creatinine, Ser: 0.86 mg/dL (ref 0.44–1.00)
GFR calc Af Amer: >60 mL/min (ref >60)
GFR calc non Af Amer: >60 mL/min (ref >60)
Glucose, Bld: 85 mg/dL (ref 70–99)
Potassium: 3.8 mmol/L (ref 3.5–5.1)
Sodium: 141 mmol/L (ref 135–145)
Total Bilirubin: 0.5 mg/dL (ref 0.3–1.2)
Total Protein: 7.1 g/dL (ref 6.5–8.1)

## 2019-11-24 LAB — T4, FREE: Free T4: 0.48 ng/dL — ABNORMAL LOW (ref 0.61–1.12)

## 2019-11-24 LAB — RESPIRATORY PANEL BY RT PCR (FLU A&B, COVID)
Influenza A by PCR: NEGATIVE
Influenza B by PCR: NEGATIVE
SARS Coronavirus 2 by RT PCR: NEGATIVE

## 2019-11-24 LAB — POCT PREGNANCY, URINE: Preg Test, Ur: NEGATIVE

## 2019-11-24 LAB — BRAIN NATRIURETIC PEPTIDE: B Natriuretic Peptide: 80 pg/mL (ref 0.0–100.0)

## 2019-11-24 LAB — TROPONIN I (HIGH SENSITIVITY)
Troponin I (High Sensitivity): <2 ng/L (ref <18)
Troponin I (High Sensitivity): 2 ng/L (ref <18)

## 2019-11-24 LAB — TSH: TSH: 3.229 u[IU]/mL (ref 0.350–4.500)

## 2019-11-24 LAB — MAGNESIUM: Magnesium: 2.1 mg/dL (ref 1.7–2.4)

## 2019-11-24 MED ORDER — MORPHINE SULFATE (PF) 4 MG/ML IV SOLN: 4.0000 mg | Freq: Once | INTRAVENOUS | Status: DC

## 2019-11-24 MED ORDER — ASPIRIN 81 MG PO CHEW
243.0000 mg | CHEWABLE_TABLET | Freq: Once | ORAL | Status: AC
  Administered 2019-11-24: 09:00:00 243 mg via ORAL
  Filled 2019-11-24: qty 3

## 2019-11-24 MED ORDER — ONDANSETRON HCL 4 MG/2ML IJ SOLN: 4.0000 mg | Freq: Once | INTRAMUSCULAR | Status: DC

## 2019-11-24 MED ORDER — IOHEXOL 350 MG/ML SOLN
75.0000 mL | Freq: Once | INTRAVENOUS | Status: AC | PRN
  Administered 2019-11-24: 100 mL via INTRAVENOUS

## 2019-11-24 NOTE — ED Notes (Signed)
IV removed from RT [facility]  Snack given to pt to eat while waiting on RN to discharge.  Pt getting dressed.  LW EDT

## 2019-11-24 NOTE — ED Notes (Signed)
Pt c/o intermittent chest tightness to the left side of chest into the left arm with SOB , feeling like her heart is racing since Thursday. Pt is in NAD

## 2019-11-24 NOTE — Discharge Instructions (Addendum)
Your work-up is reassuring including negative CT scan of your chest and negative cardiac markers.  I did give you cardiology's number to follow-up with as needed if continue to have chest discomfort but if your chest pain comes back and gets a lot worse or there is any additional changes in it you should return to the ER for evaluation.  Your blood pressure was elevated at you will need to have this rechecked with your primary care doctor in the next week to see if you need to be started on a blood pressure medicine.

## 2019-11-24 NOTE — ED Provider Notes (Signed)
[facility] Emergency Department Provider Note  ____________________________________________   First MD Initiated Contact with Patient 11/24/19 [phone]     (approximate)  I have reviewed the triage vital signs and the nursing notes.   HISTORY  Chief Complaint Chest Pain    HPI Jennifer Gomez is a 49 y.o. female with history of hyperthyroidism who comes in for chest pain and shortness of breath.  Patient states that she felt like she had a sinus infection for the past week.  She then started to develop some chest pain last Friday, 5 days ago.  The chest pain is in the middle of her chest, radiates down her arm and into her neck, moderate, nothing makes it better, nothing makes it worse.  Feels like a pressure sensation.  This been associate with some shortness of breath is been going on a little bit longer than the chest pain.  Denies the chest pain being pleuritic.  States that she has been compliant with her thyroid medicines.  Denies any fevers or coughing.  Denies any known coronavirus contacts.  Patient states she also felt like she had some superficial swelling on the left side of her neck on one of the veins.           Past Medical History:  Diagnosis Date  . Anemia   . Blood transfusion without reported diagnosis   . Thyroid disease     There are no problems to display for this patient.   History reviewed. No pertinent surgical history.  Prior to Admission medications   Medication Sig Start Date End Date Taking? Authorizing Provider  levothyroxine (SYNTHROID, LEVOTHROID) 50 MCG tablet Take 50 mcg by mouth daily before breakfast.    [provider]  predniSONE (DELTASONE) 10 MG tablet Take 6 tablets  today, on day 2 take 5 tablets, day 3 take 4 tablets, day 4 take 3 tablets, day 5 take  2 tablets and 1 tablet the last day 04/21/18   Tommi Rumps, PA-C    Allergies Patient has no known allergies.  No family history on  file.  Social History Social History   Tobacco Use  . Smoking status: Current Every Day Smoker  . Smokeless tobacco: Never Used  Substance Use Topics  . Alcohol use: No  . Drug use: No      Review of Systems Constitutional: No fever/chills Eyes: No visual changes. ENT: No sore throat. Cardiovascular: Positive chest pain Respiratory: Positive shortness of breath Gastrointestinal: No abdominal pain.  No nausea, no vomiting.  No diarrhea.  No constipation. Genitourinary: Negative for dysuria. Musculoskeletal: Negative for back pain. Skin: Negative for rash. Neurological: Negative for headaches, focal weakness or numbness. All other ROS negative ____________________________________________   PHYSICAL EXAM:  VITAL SIGNS: ED Triage Vitals  Enc Vitals Group     BP 11/24/19 0831 (!) 214/98     Pulse Rate 11/24/19 0829 (!) 110     Resp 11/24/19 0829 18     Temp 11/24/19 0829 98 F (36.7 C)     Temp Source 11/24/19 0829 Oral     SpO2 11/24/19 0829 100 %     Weight 11/24/19 0831 140 lb (63.5 kg)     Height 11/24/19 0831 5\' 5"  (1.651 m)     Head Circumference --      Peak Flow --      Pain Score 11/24/19 0831 0     Pain Loc --      Pain Edu? --  Excl. in GC? --     Constitutional: Alert and oriented. Well appearing and in no acute distress. Eyes: Conjunctivae are normal. EOMI. Head: Atraumatic. Nose: No congestion/rhinnorhea. Mouth/Throat: Mucous membranes are moist.   Neck: No stridor. Trachea Midline. FROM.  No obvious swelling on her neck.  May be a very tiny bulge on the external jugular vein but no redness or erythema fully compressible. Cardiovascular: Tachycardic, regular rhythm. Grossly normal heart sounds.  Good peripheral circulation. Respiratory: Normal respiratory effort.  No retractions. Lungs CTAB. Gastrointestinal: Soft and nontender. No distention. No abdominal bruits.  Musculoskeletal: No lower extremity tenderness nor edema.  No joint  effusions. Neurologic:  Normal speech and language. No gross focal neurologic deficits are appreciated.  Skin:  Skin is warm, dry and intact. No rash noted. Psychiatric: Mood and affect are normal. Speech and behavior are normal. GU: Deferred   ____________________________________________   LABS (all labs ordered are listed, but only abnormal results are displayed)  Labs Reviewed  CBC WITH DIFFERENTIAL/PLATELET - Abnormal; Notable for the following components:      Result Value   Hemoglobin 15.7 (*)    HCT 46.8 (*)    All other components within normal limits  COMPREHENSIVE METABOLIC PANEL - Abnormal; Notable for the following components:   BUN 23 (*)    All other components within normal limits  T4, FREE - Abnormal; Notable for the following components:   Free T4 0.48 (*)    All other components within normal limits  RESPIRATORY PANEL BY RT PCR (FLU A&B, COVID)  BRAIN NATRIURETIC PEPTIDE  MAGNESIUM  TSH  POC URINE PREG, ED  POCT PREGNANCY, URINE  TROPONIN I (HIGH SENSITIVITY)  TROPONIN I (HIGH SENSITIVITY)   ____________________________________________   ED ECG REPORT I, Concha Se, the attending physician, personally viewed and interpreted this ECG.  EKG sinus tachycardia rate of 107, no st elevation, does have RBBB with Left posterior fasc block, no twi, normal intervals.  Looks similar to prior EKG.  ____________________________________________  RADIOLOGY   Official radiology report(s): CT Angio Chest PE W and/or Wo Contrast  Result Date: 11/24/2019 CLINICAL DATA:  Intermittent chest tightness, left-sided chest pain, shortness of breath EXAM: CT ANGIOGRAPHY CHEST WITH CONTRAST TECHNIQUE: Multidetector CT imaging of the chest was performed using the standard protocol during bolus administration of intravenous contrast. Multiplanar CT image reconstructions and MIPs were obtained to evaluate the vascular anatomy. CONTRAST:  OMNIPAQUE IOHEXOL 350 MG/ML SOLN  COMPARISON:  None. FINDINGS: Cardiovascular: Satisfactory opacification of the pulmonary arteries to the segmental level. No evidence of pulmonary embolism. Normal heart size. No pericardial effusion. Mediastinum/Nodes: No enlarged mediastinal, hilar, or axillary lymph nodes. Thyroid gland, trachea, and esophagus demonstrate no significant findings. Lungs/Pleura: Lungs are clear. No pleural effusion or pneumothorax. Upper Abdomen: No acute abnormality. Musculoskeletal: No chest wall abnormality. No acute or significant osseous findings. Review of the MIP images confirms the above findings. IMPRESSION: Negative examination for pulmonary embolism. Electronically Signed   By: Lauralyn Primes M.D.   On: 11/24/2019 11:01   DG Chest Portable 1 View  Result Date: 11/24/2019 CLINICAL DATA:  Chest pain EXAM: PORTABLE CHEST 1 VIEW COMPARISON:  02/09/2013 FINDINGS: The heart size and mediastinal contours are within normal limits. No focal airspace consolidation, pleural effusion, or pneumothorax. The visualized skeletal structures are unremarkable. IMPRESSION: No active disease. Electronically Signed   By: Duanne Guess D.O.   On: 11/24/2019 09:15    ____________________________________________   PROCEDURES  Procedure(s) performed (including Critical Care):  Procedures   ____________________________________________   INITIAL IMPRESSION / ASSESSMENT AND PLAN / ED COURSE   Jennifer Gomez was evaluated in Emergency Department on 11/24/2019 for the symptoms described in the history of present illness. She was evaluated in the context of the global COVID-19 pandemic, which necessitated consideration that the patient might be at risk for infection with the SARS-CoV-2 virus that causes COVID-19. Institutional protocols and algorithms that pertain to the evaluation of patients at risk for COVID-19 are in a state of rapid change based on information released by regulatory bodies including the CDC and federal and  state organizations. These policies and algorithms were followed during the patient's care in the ED.    Initial blood pressure was in the 200s but on repeat after patient was placed in the room it was in the 170s.  Most Likely DDx:  -MSK (atypical chest pain) but will get cardiac markers to evaluate for ACS given risk factors/age -Given patient's tachycardia and shortness of breath will get CT PE.  Consider dissection given her elevated blood pressure but on repeat it was in the 150s systolic and she denies any pain radiating into her back.  DDx that was also considered d/t potential to cause harm, but was found less likely based on history and physical (as detailed above): -PNA (no fevers, cough but CXR to evaluate) -PNX (reassured with equal b/l breath sounds, CXR to evaluate) -Symptomatic anemia (will get H&H) -Aortic Dissection as no tearing pain and no radiation to the mid back, pulses equal -Pericarditis no rub on exam, EKG changes or hx to suggest dx -Tamponade (no notable SOB, tachycardic, hypotensive) -Esophageal rupture (no h/o diffuse vomitting/no crepitus)   10:36 AM reevaluated patient updated on results thus far.  Cardiac markers negative.  Will get repeat.  No evidence of hyperthyroid or thyroid storm.  Bedside ultrasound of the external jugular did not show evidence of clot.  Discussed with patient that I  Hardly really see any swelling at this time of her neck.  Denies using a [place].  Denies dizziness or loss of consciousness I have low suspicion for dissection.  We discussed using warm compresses and to return to the ER if she develops worsening swelling, redness or pain in the neck.  CT PE is negative.  Repeat troponin is negative.  Reevaluated patient she continues to be asymptomatic.  Blood pressures are come down.  Discussed with patient getting a repeat blood pressure in the next week to see if she needs to be started on a blood pressure medicine.  Patient  expressed understanding.  Patient will follow up with her primary care doctor  I discussed the provisional nature of ED diagnosis, the treatment so far, the ongoing plan of care, follow up appointments and return precautions with the patient and any family or support people present. They expressed understanding and agreed with the plan, discharged home.   ____________________________________________   FINAL CLINICAL IMPRESSION(S) / ED DIAGNOSES   Final diagnoses:  Chest pain, unspecified type     MEDICATIONS GIVEN DURING THIS VISIT:  Medications  morphine 4 MG/ML injection 4 mg (4 mg Intravenous Refused 11/24/19 0921)  ondansetron (ZOFRAN) injection 4 mg (4 mg Intravenous Refused 11/24/19 0927)  aspirin chewable tablet 243 mg (243 mg Oral Given 11/24/19 0925)  iohexol (OMNIPAQUE) 350 MG/ML injection 75 mL (100 mLs Intravenous Contrast Given 11/24/19 1043)     ED Discharge Orders    None       Note:  This document  was prepared using Systems analyst and may include unintentional dictation errors.   Vanessa Addison, MD 11/24/19 1228

## 2019-11-24 NOTE — ED Triage Notes (Signed)
Pt states intermittent heaviness in chest since yesterday, anxious and tearful in triage. States she has issues with her thyroid as well. NAD.

## 2021-12-09 ENCOUNTER — Encounter: Payer: Self-pay | Admitting: Intensive Care

## 2021-12-09 ENCOUNTER — Emergency Department: Payer: Self-pay

## 2021-12-09 ENCOUNTER — Other Ambulatory Visit: Payer: Self-pay

## 2021-12-09 ENCOUNTER — Emergency Department
Admission: EM | Admit: 2021-12-09 | Discharge: 2021-12-09 | Disposition: A | Discharge status: Home / Self Care | Payer: Self-pay | Attending: Student in an Organized Health Care Education/Training Program | Admitting: Student in an Organized Health Care Education/Training Program

## 2021-12-09 DIAGNOSIS — R0789 Other chest pain: Secondary | ICD-10-CM

## 2021-12-09 DIAGNOSIS — E039 Hypothyroidism, unspecified: Secondary | ICD-10-CM | POA: Insufficient documentation

## 2021-12-09 DIAGNOSIS — I10 Essential (primary) hypertension: Secondary | ICD-10-CM | POA: Insufficient documentation

## 2021-12-09 LAB — BASIC METABOLIC PANEL
Anion gap: 5 (ref 5–15)
BUN: 20 mg/dL (ref 6–20)
CO2: 26 mmol/L (ref 22–32)
Calcium: 9.4 mg/dL (ref 8.9–10.3)
Chloride: 108 mmol/L (ref 98–111)
Creatinine, Ser: 0.8 mg/dL (ref 0.44–1.00)
GFR, Estimated: >60 mL/min (ref >60)
Glucose, Bld: 99 mg/dL (ref 70–99)
Potassium: 4 mmol/L (ref 3.5–5.1)
Sodium: 139 mmol/L (ref 135–145)

## 2021-12-09 LAB — CBC
HCT: 42.2 % (ref 36.0–46.0)
Hemoglobin: 13.8 g/dL (ref 12.0–15.0)
MCH: 32.4 pg (ref 26.0–34.0)
MCHC: 32.7 g/dL (ref 30.0–36.0)
MCV: 99.1 fL (ref 80.0–100.0)
Platelets: 213 10*3/uL (ref 150–400)
RBC: 4.26 MIL/uL (ref 3.87–5.11)
RDW: 13.4 % (ref 11.5–15.5)
WBC: 7.3 10*3/uL (ref 4.0–10.5)
nRBC: 0 % (ref 0.0–0.2)

## 2021-12-09 LAB — T4, FREE: Free T4: 0.79 ng/dL (ref 0.61–1.12)

## 2021-12-09 LAB — TROPONIN I (HIGH SENSITIVITY)
Troponin I (High Sensitivity): 7 ng/L (ref <18)
Troponin I (High Sensitivity): 8 ng/L (ref <18)

## 2021-12-09 LAB — D-DIMER, QUANTITATIVE: D-Dimer, Quant: <0.27 ug/mL-FEU (ref 0.00–0.50)

## 2021-12-09 LAB — TSH: TSH: 0.414 u[IU]/mL (ref 0.350–4.500)

## 2021-12-09 NOTE — ED Triage Notes (Signed)
Patient c/o chest heaviness with radiation up to left side of neck. Reports symptoms since Thanksgiving 2022 but worsened today.

## 2021-12-09 NOTE — ED Provider Notes (Signed)
[facility] Provider Note    Event Date/Time   First MD Initiated Contact with Patient 12/09/21 1559     (approximate)   History   Chest Pain   HPI  Jennifer Gomez is a 51 y.o. female with a history of hypothyroidism presents to the ER for evaluation of several months of intermittent swelling tingling discoloration of her left foot and fingertips.  States she is also having muscle cramps periodically feels like she is going downhill since Thanksgiving.  Having some intermittent chest discomfort denies any shortness of breath.  She is worried that she has a clot.  She denies any chest pain at this time no nausea or vomiting no fevers.     Physical Exam   Triage Vital Signs: ED Triage Vitals  Enc Vitals Group     BP 12/09/21 1556 (!) 211/91     Pulse Rate 12/09/21 1556 94     Resp 12/09/21 1556 18     Temp 12/09/21 1556 98.6 F (37 C)     Temp Source 12/09/21 1556 Oral     SpO2 12/09/21 1556 99 %     Weight 12/09/21 1553 137 lb 3.2 oz (62.2 kg)     Height 12/09/21 1553 5\' 4"  (1.626 m)     Head Circumference --      Peak Flow --      Pain Score 12/09/21 1553 6     Pain Loc --      Pain Edu? --      Excl. in [place]? --     Most recent vital signs: Vitals:   12/09/21 1830 12/09/21 1900  BP: (!) 180/99 (!) 149/77  Pulse: 73 66  Resp: 10 11  Temp:  98.4 F (36.9 C)  SpO2: 99% 99%     Constitutional: Alert  Eyes: Conjunctivae are normal.  Head: Atraumatic. Nose: No congestion/rhinnorhea. Mouth/Throat: Mucous membranes are moist.   Neck: Painless ROM.  Cardiovascular:   Good peripheral circulation.  Strong pt and dp pulse, normal heart sounds. Respiratory: Normal respiratory effort.  No retractions.  Gastrointestinal: Soft and nontender.  Musculoskeletal:  no deformity Neurologic:  MAE spontaneously. No gross focal neurologic deficits are appreciated.  Skin:  Skin is warm, dry and intact. No rash noted. Psychiatric: Mood and affect are  normal. Speech and behavior are normal.    ED Results / Procedures / Treatments   Labs (all labs ordered are listed, but only abnormal results are displayed) Labs Reviewed  BASIC METABOLIC PANEL  CBC  TSH  T4, FREE  D-DIMER, QUANTITATIVE (NOT AT [facility])  TROPONIN I (HIGH SENSITIVITY)  TROPONIN I (HIGH SENSITIVITY)     EKG  ED ECG REPORT I, Merlyn Lot, the attending physician, personally viewed and interpreted this ECG.   Date: 12/09/2021  EKG Time: 15:50  Rate: 90  Rhythm: sinus  Axis: right  Intervals:rbbb  ST&T Change: nonspecific st abn, no stemi, unchanged morphology as compared to 11/25/19    RADIOLOGY Please see ED Course for my review and interpretation.  I personally reviewed all radiographic images ordered to evaluate for the above acute complaints and reviewed radiology reports and findings.  These findings were personally discussed with the patient.  Please see medical record for radiology report.    PROCEDURES:  Critical Care performed: No  Procedures   MEDICATIONS ORDERED IN ED: Medications - No data to display   IMPRESSION / MDM / [place] / ED COURSE  I reviewed the  triage vital signs and the nursing notes.                              Differential diagnosis includes, but is not limited to, ACS, pericarditis, pe, dissection, pna, bronchitis, costochondritis  Patient presenting with symptoms as described above.  Clinically nontoxic-appearing somewhat anxious.  EKG is unchanged compared to previous.  Blood will be sent for the blood differential.  She is well-perfused throughout.  Have a low suspicion for PE or dissection.  Her abdominal exam is benign.  Will check thyroid levels given her history.  Clinical Course as of 12/09/21 2250  Sat Dec 09, 2021  1614 Chest x-ray by my review does not show any evidence of pneumothorax or cardiomegaly no infiltrate noted. [PR]  1700 Blood work is reassuring normal BMP normal renal  function normal glucose no acidemia normal troponin.  No white count no anemia. [PR]  1742 T4 and TSH are normal.  Venous ultrasound without evidence of DVT. [PR]  [id number] Patient reassessed.  She feels well.  We discussed results of work-up thus far.  Plan for repeat troponin.  Considered further observation in the hospital given her age and presentation but given reassuring work-up as she is currently pain-free I think that if repeat troponin is negative that outpatient follow-up will be perfectly reasonable.  Patient agreeable to plan. [PR]  1912 Repeat troponin negative.  Patient remains asymptomatic.  We discussed her elevated blood pressure.  Was recently started on lisinopril.  States that she will follow-up with PCP regarding further titration of her blood pressure medication.  Does appear stable and appropriate for outpatient follow-up. [PR]    Clinical Course User Index [PR] Merlyn Lot, MD     FINAL CLINICAL IMPRESSION(S) / ED DIAGNOSES   Final diagnoses:  Atypical chest pain  Hypertension, unspecified type     Rx / DC Orders   ED Discharge Orders     None        Note:  This document was prepared using Dragon voice recognition software and may include unintentional dictation errors.    Merlyn Lot, MD 12/09/21 2251

## 2022-05-26 ENCOUNTER — Emergency Department
Admission: EM | Admit: 2022-05-26 | Discharge: 2022-05-26 | Disposition: A | Discharge status: Home / Self Care | Payer: Self-pay | Attending: Emergency Medicine | Admitting: Emergency Medicine

## 2022-05-26 ENCOUNTER — Other Ambulatory Visit: Payer: Self-pay

## 2022-05-26 ENCOUNTER — Emergency Department: Payer: Self-pay

## 2022-05-26 ENCOUNTER — Encounter: Payer: Self-pay | Admitting: Emergency Medicine

## 2022-05-26 DIAGNOSIS — R531 Weakness: Secondary | ICD-10-CM | POA: Insufficient documentation

## 2022-05-26 DIAGNOSIS — J3489 Other specified disorders of nose and nasal sinuses: Secondary | ICD-10-CM | POA: Insufficient documentation

## 2022-05-26 DIAGNOSIS — R002 Palpitations: Secondary | ICD-10-CM | POA: Insufficient documentation

## 2022-05-26 DIAGNOSIS — H9202 Otalgia, left ear: Secondary | ICD-10-CM | POA: Insufficient documentation

## 2022-05-26 DIAGNOSIS — E039 Hypothyroidism, unspecified: Secondary | ICD-10-CM | POA: Insufficient documentation

## 2022-05-26 DIAGNOSIS — R0602 Shortness of breath: Secondary | ICD-10-CM | POA: Insufficient documentation

## 2022-05-26 DIAGNOSIS — R059 Cough, unspecified: Secondary | ICD-10-CM | POA: Insufficient documentation

## 2022-05-26 LAB — COMPREHENSIVE METABOLIC PANEL
ALT: 15 U/L (ref 0–44)
AST: 19 U/L (ref 15–41)
Albumin: 3.8 g/dL (ref 3.5–5.0)
Alkaline Phosphatase: 65 U/L (ref 38–126)
Anion gap: 7 (ref 5–15)
BUN: 20 mg/dL (ref 6–20)
CO2: 23 mmol/L (ref 22–32)
Calcium: 8.9 mg/dL (ref 8.9–10.3)
Chloride: 109 mmol/L (ref 98–111)
Creatinine, Ser: 0.69 mg/dL (ref 0.44–1.00)
GFR, Estimated: >60 mL/min (ref >60)
Glucose, Bld: 131 mg/dL — ABNORMAL HIGH (ref 70–99)
Potassium: 3.3 mmol/L — ABNORMAL LOW (ref 3.5–5.1)
Sodium: 139 mmol/L (ref 135–145)
Total Bilirubin: 0.3 mg/dL (ref 0.3–1.2)
Total Protein: 6.7 g/dL (ref 6.5–8.1)

## 2022-05-26 LAB — CBC WITH DIFFERENTIAL/PLATELET
Abs Immature Granulocytes: 0.03 10*3/uL (ref 0.00–0.07)
Basophils Absolute: 0 10*3/uL (ref 0.0–0.1)
Basophils Relative: 0 %
Eosinophils Absolute: 0.1 10*3/uL (ref 0.0–0.5)
Eosinophils Relative: 1 %
HCT: 34.5 % — ABNORMAL LOW (ref 36.0–46.0)
Hemoglobin: 10.9 g/dL — ABNORMAL LOW (ref 12.0–15.0)
Immature Granulocytes: 0 %
Lymphocytes Relative: 18 %
Lymphs Abs: 1.9 10*3/uL (ref 0.7–4.0)
MCH: 29.6 pg (ref 26.0–34.0)
MCHC: 31.6 g/dL (ref 30.0–36.0)
MCV: 93.8 fL (ref 80.0–100.0)
Monocytes Absolute: 0.6 10*3/uL (ref 0.1–1.0)
Monocytes Relative: 6 %
Neutro Abs: 7.9 10*3/uL — ABNORMAL HIGH (ref 1.7–7.7)
Neutrophils Relative %: 75 %
Platelets: 214 10*3/uL (ref 150–400)
RBC: 3.68 MIL/uL — ABNORMAL LOW (ref 3.87–5.11)
RDW: 14.5 % (ref 11.5–15.5)
WBC: 10.6 10*3/uL — ABNORMAL HIGH (ref 4.0–10.5)
nRBC: 0 % (ref 0.0–0.2)

## 2022-05-26 LAB — TSH: TSH: 0.285 u[IU]/mL — ABNORMAL LOW (ref 0.350–4.500)

## 2022-05-26 LAB — T4, FREE: Free T4: 0.7 ng/dL (ref 0.61–1.12)

## 2022-05-26 MED ORDER — AZITHROMYCIN 250 MG PO TABS: ORAL_TABLET | ORAL | 0 refills | Status: AC

## 2022-05-26 MED ORDER — PREDNISONE 10 MG (21) PO TBPK: ORAL_TABLET | ORAL | 0 refills | Status: DC

## 2022-05-26 NOTE — ED Provider Triage Note (Signed)
Emergency Medicine Provider Triage Evaluation Note  Jennifer Gomez , a 51 y.o. female  was evaluated in triage.  Pt complains of fatigue, sinus pain, palpitations.  Shortness of breath for the last 2 days without cough, fever, chills, nausea or vomiting. No TSH done in a "long time".  Patient adjust her thyroid medicine as to what her weight is.  Review of Systems  Positive:  Fatigue, shortness of breath, sinus pressure Negative: No fever, chills, n, v, or d.   Physical Exam  BP (!) 184/88 (BP Location: Left Arm)   Pulse 89   Temp 98.2 F (36.8 C) (Oral)   Resp 20   Ht 5\' 5"  (1.651 m)   SpO2 99%   BMI 22.83 kg/m  Gen:   Awake, no distress   Resp:  Normal effort Lungs MSK:   Moves extremities without difficulty  Other:    Medical Decision Making  Medically screening exam initiated at 11:04 AM.  Appropriate orders placed.  Jennifer Gomez was informed that the remainder of the evaluation will be completed by another provider, this initial triage assessment does not replace that evaluation, and the importance of remaining in the ED until their evaluation is complete.     Renato Shin, PA-C 05/26/22 1113

## 2022-05-26 NOTE — ED Triage Notes (Signed)
Pt via POV from home. Pt c/o weakness, sleepiness, palpitations, sinus pressure, and SOB for the past 2 days. Endorses dry cough. Pt is A&Ox4 and NAD  Pt has a hx of hyperthyroidism.

## 2022-05-26 NOTE — ED Notes (Signed)
First Nurse Note: Pt to ED via POV c/o left ear infection, sinus pressure, pt states that she feels like her heart is racing and that she has been tired for the past few days as well. Pt reports that she feels like she is "struggling to breath". Pts respirations are equal and unlabored at this time. Pt is speaking in complete sentences. No accessory muscle use noted.

## 2022-06-05 ENCOUNTER — Emergency Department: Payer: Self-pay

## 2022-06-05 ENCOUNTER — Emergency Department
Admission: EM | Admit: 2022-06-05 | Discharge: 2022-06-05 | Disposition: A | Discharge status: Home / Self Care | Payer: Self-pay | Attending: Emergency Medicine | Admitting: Emergency Medicine

## 2022-06-05 ENCOUNTER — Other Ambulatory Visit: Payer: Self-pay

## 2022-06-05 ENCOUNTER — Encounter: Payer: Self-pay | Admitting: Emergency Medicine

## 2022-06-05 DIAGNOSIS — R531 Weakness: Secondary | ICD-10-CM | POA: Insufficient documentation

## 2022-06-05 DIAGNOSIS — R590 Localized enlarged lymph nodes: Secondary | ICD-10-CM | POA: Insufficient documentation

## 2022-06-05 LAB — URINALYSIS, ROUTINE W REFLEX MICROSCOPIC
Bacteria, UA: NONE SEEN
Bilirubin Urine: NEGATIVE
Glucose, UA: NEGATIVE mg/dL
Hgb urine dipstick: NEGATIVE
Ketones, ur: NEGATIVE mg/dL
Leukocytes,Ua: NEGATIVE
Nitrite: NEGATIVE
Protein, ur: NEGATIVE mg/dL
Specific Gravity, Urine: 1.008 (ref 1.005–1.030)
pH: 6 (ref 5.0–8.0)

## 2022-06-05 LAB — BASIC METABOLIC PANEL
Anion gap: 6 (ref 5–15)
BUN: 18 mg/dL (ref 6–20)
CO2: 23 mmol/L (ref 22–32)
Calcium: 8.9 mg/dL (ref 8.9–10.3)
Chloride: 105 mmol/L (ref 98–111)
Creatinine, Ser: 0.67 mg/dL (ref 0.44–1.00)
GFR, Estimated: >60 mL/min (ref >60)
Glucose, Bld: 112 mg/dL — ABNORMAL HIGH (ref 70–99)
Potassium: 4.2 mmol/L (ref 3.5–5.1)
Sodium: 134 mmol/L — ABNORMAL LOW (ref 135–145)

## 2022-06-05 LAB — CBC
HCT: 38 % (ref 36.0–46.0)
Hemoglobin: 12.2 g/dL (ref 12.0–15.0)
MCH: 30.2 pg (ref 26.0–34.0)
MCHC: 32.1 g/dL (ref 30.0–36.0)
MCV: 94.1 fL (ref 80.0–100.0)
Platelets: 236 10*3/uL (ref 150–400)
RBC: 4.04 MIL/uL (ref 3.87–5.11)
RDW: 15.4 % (ref 11.5–15.5)
WBC: 9.9 10*3/uL (ref 4.0–10.5)
nRBC: 0 % (ref 0.0–0.2)

## 2022-06-05 MED ORDER — IOHEXOL 300 MG/ML  SOLN
75.0000 mL | Freq: Once | INTRAMUSCULAR | Status: AC | PRN
  Administered 2022-06-05: 75 mL via INTRAVENOUS

## 2022-06-05 NOTE — ED Triage Notes (Signed)
Says sh has been on antibiiotics.  Says left ear, all her lymph nodes are swollen.  Says neck is sore.

## 2022-06-05 NOTE — ED Notes (Signed)
See triage note  Presents with sore throat and swollen glands for the past 2 weeks . Afebrile on arrival Also has had some fatigue

## 2022-06-05 NOTE — ED Provider Notes (Signed)
[facility] Provider Note    Event Date/Time   First MD Initiated Contact with Patient 06/05/22 1102     (approximate)  History   Chief Complaint: Weakness  HPI  Jennifer Gomez is a 51 y.o. female with a past medical history of anemia who presents to the emergency department for multiple complaints.  According to the patient for the past 2 weeks she has had swelling to the left side of her neck that has been somewhat tender and painful when she moves her neck.  Patient was seen previously and was started on a steroid course and antibiotic.  She states no improvement after finishing the steroids she is currently still taking the antibiotics.  Patient has a follow-up appointment tomorrow with her primary care doctor but came to the emergency department today for recheck/reevaluation.  She also states she has been feeling very fatigued recently.  I reviewed the patient's visit from approximately 1 week ago patient's lab work at that time is reassuring including her T4.  Patient denies any sore throat, no fever.  Patient states a history of chronic sinusitis but denies any acute flare currently.  Physical Exam   Triage Vital Signs: ED Triage Vitals  Enc Vitals Group     BP 06/05/22 0907 130/63     Pulse Rate 06/05/22 0907 98     Resp 06/05/22 0907 16     Temp 06/05/22 0907 98.9 F (37.2 C)     Temp Source 06/05/22 0907 Oral     SpO2 06/05/22 0907 98 %     Weight 06/05/22 1053 137 lb 2 oz (62.2 kg)     Height 06/05/22 1053 5\' 5"  (1.651 m)     Head Circumference --      Peak Flow --      Pain Score 06/05/22 0900 7     Pain Loc --      Pain Edu? --      Excl. in [place]? --     Most recent vital signs: Vitals:   06/05/22 0907  BP: 130/63  Pulse: 98  Resp: 16  Temp: 98.9 F (37.2 C)  SpO2: 98%    General: Awake, no distress.  CV:  Good peripheral perfusion.  Regular rate and rhythm  Resp:  Normal effort.  Equal breath sounds bilaterally.  Abd:  No  distention.  Soft, nontender.  No rebound or guarding. Other:  Mild left anterior cervical lymphadenopathy with mild tenderness to palpation.  Normal oropharynx.  No ear discomfort.   ED Results / Procedures / Treatments   RADIOLOGY  I personally reviewed and interpreted the CT images of the neck I do not see any obvious abnormality on my evaluation. Radiology is read the CT scan is negative   MEDICATIONS ORDERED IN ED: Medications - No data to display   IMPRESSION / MDM / ASSESSMENT AND PLAN / ED COURSE  I reviewed the triage vital signs and the nursing notes.  Patient's presentation is most consistent with acute presentation with potential threat to life or bodily function.  Patient presents emergency department for left-sided anterior cervical lymphadenopathy feeling weak.  Patient was seen in the emergency department 1 week ago overall reassuring work-up.  Patient's lab work today is reassuring including a normal chemistry normal CBC.  Patient's free T4 was normal 1 week ago.  Given the patient's continued left neck pain with cervical lymphadenopathy patient wishes for imaging we will proceed with a CT scan with contrast to  evaluate.  Patient does have a follow-up appointment tomorrow morning with her doctor.  Overall the patient appears quite well during my examination.  CT scan is negative.  Patient will follow-up with her primary care doctor tomorrow as scheduled.  FINAL CLINICAL IMPRESSION(S) / ED DIAGNOSES   Anterior cervical lymphadenopathy Weakness  Note:  This document was prepared using Dragon voice recognition software and may include unintentional dictation errors.   Minna Antis, MD 06/05/22 1302

## 2022-06-11 NOTE — ED Provider Notes (Signed)
[facility] Provider Note   Event Date/Time   First MD Initiated Contact with Patient 05/26/22 1337     (approximate) History  Weakness and Shortness of Breath  HPI Jennifer Gomez is a 51 y.o. female who presents for generalized weakness, lethargy, sinus pressure, palpitations, and shortness of breath over the last 2 days.  Patient also endorses dry cough.  Patient states she has a history of hypothyroidism certain that her thyroid levels may be "out of whack".  Patient denies any recent travel, sick contacts, or food out of the ordinary. ROS: Patient currently denies any vision changes, tinnitus, difficulty speaking, facial droop, sore throat, chest pain, abdominal pain, nausea/vomiting/diarrhea, dysuria, or weakness/numbness/paresthesias in any extremity   Physical Exam  Triage Vital Signs: ED Triage Vitals  Enc Vitals Group     BP 05/26/22 1101 (!) 184/88     Pulse Rate 05/26/22 1101 89     Resp 05/26/22 1101 20     Temp 05/26/22 1103 98.2 F (36.8 C)     Temp Source 05/26/22 1103 Oral     SpO2 05/26/22 1101 99 %     Weight --      Height 05/26/22 1101 5\' 5"  (1.651 m)     Head Circumference --      Peak Flow --      Pain Score 05/26/22 1458 0     Pain Loc --      Pain Edu? --      Excl. in [place]? --    Most recent vital signs: Vitals:   05/26/22 1338 05/26/22 1400  BP: (!) 148/78 137/74  Pulse: 75 87  Resp: 16 16  Temp: 98.4 F (36.9 C)   SpO2: 96% 96%   General: Awake, oriented x4. CV:  Good peripheral perfusion.  Resp:  Normal effort.  Abd:  No distention.  Other:  Middle-aged Caucasian female laying in bed in no acute distress ED Results / Procedures / Treatments  Labs (all labs ordered are listed, but only abnormal results are displayed) Labs Reviewed  COMPREHENSIVE METABOLIC PANEL - Abnormal; Notable for the following components:      Result Value   Potassium 3.3 (*)    Glucose, Bld 131 (*)    All other components within normal  limits  CBC WITH DIFFERENTIAL/PLATELET - Abnormal; Notable for the following components:   WBC 10.6 (*)    RBC 3.68 (*)    Hemoglobin 10.9 (*)    HCT 34.5 (*)    Neutro Abs 7.9 (*)    All other components within normal limits  TSH - Abnormal; Notable for the following components:   TSH 0.285 (*)    All other components within normal limits  T4, FREE   EKG ED ECG REPORT I, 05/28/22, the attending physician, personally viewed and interpreted this ECG. Date: 06/11/2022 EKG Time: 1106 Rate: 87 Rhythm: normal sinus rhythm QRS Axis: normal Intervals: normal ST/T Wave abnormalities: normal Narrative Interpretation: no evidence of acute ischemia RADIOLOGY ED MD interpretation: 2 view chest x-ray interpreted by me shows no evidence of acute abnormalities including no pneumonia, pneumothorax, or widened mediastinum -Agree with radiology assessment Official radiology report(s): No results found. PROCEDURES: Critical Care performed: No Procedures MEDICATIONS ORDERED IN ED: Medications - No data to display IMPRESSION / MDM / ASSESSMENT AND PLAN / ED COURSE  I reviewed the triage vital signs and the nursing notes.  The patient is on the cardiac monitor to evaluate for evidence of arrhythmia and/or significant heart rate changes. Patient's presentation is most consistent with acute presentation with potential threat to life or bodily function. Otherwise healthy patient presenting with constellation of symptoms likely representing uncomplicated viral upper respiratory symptoms as characterized by mild pharyngitis  Unlikely PTA/RPA: no hot potato voice, no uvular deviation, Unlikely Esophageal rupture: No history of dysphagia Unlikely deep space infection/Ludwigs Low suspicion for CNS infection bacterial sinusitis, or pneumonia given exam and history.  Unlikely Strep or EBV as centor negative and with no pharyngeal exudate, posterior LAD, or  splenomegaly.  Will attempt to alleviate symptoms conservatively; no overt indications at this time for antibiotics. No respiratory distress, otherwise relatively well appearing and nontoxic. Will discuss prompt follow up with PMD and strict return precautions.   FINAL CLINICAL IMPRESSION(S) / ED DIAGNOSES   Final diagnoses:  Sinus pressure  Left ear pain   Rx / DC Orders   ED Discharge Orders          Ordered    predniSONE (STERAPRED UNI-PAK 21 TAB) 10 MG (21) TBPK tablet        05/26/22 1450    azithromycin (ZITHROMAX Z-PAK) 250 MG tablet        05/26/22 1450           Note:  This document was prepared using Dragon voice recognition software and may include unintentional dictation errors.   Merwyn Katos, MD 06/11/22 [phone]

## 2022-11-25 ENCOUNTER — Other Ambulatory Visit: Payer: Self-pay

## 2022-11-25 ENCOUNTER — Emergency Department: Payer: Self-pay

## 2022-11-25 ENCOUNTER — Emergency Department
Admission: EM | Admit: 2022-11-25 | Discharge: 2022-11-25 | Disposition: A | Discharge status: Home / Self Care | Payer: Self-pay | Attending: Emergency Medicine | Admitting: Emergency Medicine

## 2022-11-25 DIAGNOSIS — R1031 Right lower quadrant pain: Secondary | ICD-10-CM | POA: Insufficient documentation

## 2022-11-25 DIAGNOSIS — R109 Unspecified abdominal pain: Secondary | ICD-10-CM

## 2022-11-25 LAB — BASIC METABOLIC PANEL
Anion gap: 10 (ref 5–15)
BUN: 15 mg/dL (ref 6–20)
CO2: 25 mmol/L (ref 22–32)
Calcium: 9.1 mg/dL (ref 8.9–10.3)
Chloride: 100 mmol/L (ref 98–111)
Creatinine, Ser: 0.84 mg/dL (ref 0.44–1.00)
GFR, Estimated: >60 mL/min (ref >60)
Glucose, Bld: 95 mg/dL (ref 70–99)
Potassium: 4 mmol/L (ref 3.5–5.1)
Sodium: 135 mmol/L (ref 135–145)

## 2022-11-25 LAB — CBC
HCT: 39.9 % (ref 36.0–46.0)
Hemoglobin: 12.7 g/dL (ref 12.0–15.0)
MCH: 30.7 pg (ref 26.0–34.0)
MCHC: 31.8 g/dL (ref 30.0–36.0)
MCV: 96.4 fL (ref 80.0–100.0)
Platelets: 235 10*3/uL (ref 150–400)
RBC: 4.14 MIL/uL (ref 3.87–5.11)
RDW: 13.9 % (ref 11.5–15.5)
WBC: 8.3 10*3/uL (ref 4.0–10.5)
nRBC: 0 % (ref 0.0–0.2)

## 2022-11-25 LAB — URINALYSIS, ROUTINE W REFLEX MICROSCOPIC
Bilirubin Urine: NEGATIVE
Glucose, UA: NEGATIVE mg/dL
Hgb urine dipstick: NEGATIVE
Ketones, ur: NEGATIVE mg/dL
Leukocytes,Ua: NEGATIVE
Nitrite: NEGATIVE
Protein, ur: NEGATIVE mg/dL
Specific Gravity, Urine: 1.015 (ref 1.005–1.030)
pH: 5 (ref 5.0–8.0)

## 2022-11-25 LAB — HEPATIC FUNCTION PANEL
ALT: 11 U/L (ref 0–44)
AST: 18 U/L (ref 15–41)
Albumin: 3.8 g/dL (ref 3.5–5.0)
Alkaline Phosphatase: 68 U/L (ref 38–126)
Bilirubin, Direct: <0.1 mg/dL (ref 0.0–0.2)
Total Bilirubin: 0.5 mg/dL (ref 0.3–1.2)
Total Protein: 7.6 g/dL (ref 6.5–8.1)

## 2022-11-25 LAB — LIPASE, BLOOD: Lipase: 29 U/L (ref 11–51)

## 2022-11-25 MED ORDER — KETOROLAC TROMETHAMINE 30 MG/ML IJ SOLN
30.0000 mg | Freq: Once | INTRAMUSCULAR | Status: AC
  Administered 2022-11-25: 30 mg via INTRAMUSCULAR
  Filled 2022-11-25: qty 1

## 2022-11-25 MED ORDER — TRAMADOL HCL 50 MG PO TABS
50.0000 mg | ORAL_TABLET | Freq: Four times a day (QID) | ORAL | 0 refills | Status: AC | PRN
Start: 2022-11-25 — End: 2023-11-25

## 2022-11-25 MED ORDER — ONDANSETRON HCL 4 MG/2ML IJ SOLN
4.0000 mg | Freq: Once | INTRAMUSCULAR | Status: DC
  Filled 2022-11-25: qty 2

## 2022-11-25 MED ORDER — MORPHINE SULFATE (PF) 4 MG/ML IV SOLN
4.0000 mg | Freq: Once | INTRAVENOUS | Status: DC
  Filled 2022-11-25: qty 1

## 2022-11-25 NOTE — ED Notes (Signed)
See triage note. Pt came to ED for flank pain and abdominal pain. States saw MD yesterday and had a shot of abx. Pt states pain level is a 3 laying down but when moving it is a 6. Pt is hard to get blood and IV on states attempted 1x and it blew. Pt has tiny veins and has always been a hard stick she states she would rather refuse nausea and pain meds and not get IV. MD Kinner aware and okay with this plan.

## 2022-11-25 NOTE — ED Provider Notes (Signed)
[facility] Provider Note    Event Date/Time   First MD Initiated Contact with Patient 11/25/22 1652     (approximate)   History   Back Pain and Flank Pain   HPI  Jennifer Gomez is a 52 y.o. female who presents with complaints of right lower back pain and some right lower abdominal pain as above, she reports has been ongoing for several nights.  She suspects it could be a kidney stone.  Also saw her PCP yesterday who gave her shot of antibiotics reportedly.  No fevers reported although some intermittent chills.     Physical Exam   Triage Vital Signs: ED Triage Vitals  Enc Vitals Group     BP 11/25/22 1649 (!) 159/99     Pulse Rate 11/25/22 1649 (!) 117     Resp 11/25/22 1649 15     Temp 11/25/22 1649 97.8 F (36.6 C)     Temp Source 11/25/22 1649 Oral     SpO2 11/25/22 1649 98 %     Weight 11/25/22 1646 62.6 kg (138 lb)     Height 11/25/22 1646 1.6 m (5\' 3" )     Head Circumference --      Peak Flow --      Pain Score 11/25/22 1646 7     Pain Loc --      Pain Edu? --      Excl. in [place]? --     Most recent vital signs: Vitals:   11/25/22 1649 11/25/22 1844  BP: (!) 159/99 127/86  Pulse: (!) 117 (!) 106  Resp: 15 20  Temp: 97.8 F (36.6 C) 98.4 F (36.9 C)  SpO2: 98% 100%     General: Awake, no distress.  CV:  Good peripheral perfusion.  Resp:  Normal effort.  Abd:  No distention.  No CVA tenderness Other:     ED Results / Procedures / Treatments   Labs (all labs ordered are listed, but only abnormal results are displayed) Labs Reviewed  URINALYSIS, ROUTINE W REFLEX MICROSCOPIC - Abnormal; Notable for the following components:      Result Value   Color, Urine YELLOW (*)    APPearance CLEAR (*)    All other components within normal limits  BASIC METABOLIC PANEL  CBC  HEPATIC FUNCTION PANEL  LIPASE, BLOOD     EKG     RADIOLOGY CT renal stone study viewed interpret by me, no  ureterolithiasis    PROCEDURES:  Critical Care performed:   Procedures   MEDICATIONS ORDERED IN ED: Medications  ketorolac (TORADOL) 30 MG/ML injection 30 mg (30 mg Intramuscular Given 11/25/22 1847)     IMPRESSION / MDM / ASSESSMENT AND PLAN / ED COURSE  I reviewed the triage vital signs and the nursing notes. Patient's presentation is most consistent with acute presentation with potential threat to life or bodily function.  Patient presents with pain as above, suspicious for possible ureterolithiasis, UTI, musculoskeletal pain  Will treat with IV morphine, IV Zofran, obtain CT renal stone study, obtain labs.  Lab work is quite reassuring, urinalysis is overall reassuring, CT renal stone study without evidence of urolithiasis or appendicitis  Patient refused IV morphine and IV Zofran, treated with IM Toradol with some improvement, no indication for admission at this time, recommend continue taking the antibiotics that were prescribed for her and follow-up closely as an outpatient, return to the ED if any worsening symptoms.        FINAL CLINICAL  IMPRESSION(S) / ED DIAGNOSES   Final diagnoses:  Flank pain     Rx / DC Orders   ED Discharge Orders          Ordered    traMADol (ULTRAM) 50 MG tablet  Every 6 hours PRN        11/25/22 1839             Note:  This document was prepared using Dragon voice recognition software and may include unintentional dictation errors.   Lavonia Drafts, MD 11/25/22 [phone]

## 2022-11-25 NOTE — ED Triage Notes (Signed)
Pt to ED for R lower back pain and R lower abdominal pain or possibly flank pain since Wed night. Pt thought was kidney stone, unsure. Saw PCP yesterday who gave a shot of abx, still not better. Pain woke her up at 0500 today. Has been having cold chills.  No urinary symptoms. Has appendix. Pt also complaining of all over abdominal pain. Pain comes in waves. Is nauseous.

## 2022-11-26 ENCOUNTER — Encounter: Payer: Self-pay | Admitting: Primary Care

## 2023-12-05 ENCOUNTER — Emergency Department: Payer: Self-pay

## 2023-12-05 ENCOUNTER — Encounter: Payer: Self-pay | Admitting: Emergency Medicine

## 2023-12-05 ENCOUNTER — Emergency Department
Admission: EM | Admit: 2023-12-05 | Discharge: 2023-12-05 | Disposition: A | Discharge status: Home / Self Care | Payer: Self-pay | Attending: Emergency Medicine | Admitting: Emergency Medicine

## 2023-12-05 ENCOUNTER — Other Ambulatory Visit: Payer: Self-pay

## 2023-12-05 DIAGNOSIS — J40 Bronchitis, not specified as acute or chronic: Secondary | ICD-10-CM

## 2023-12-05 DIAGNOSIS — R509 Fever, unspecified: Secondary | ICD-10-CM | POA: Insufficient documentation

## 2023-12-05 DIAGNOSIS — R062 Wheezing: Secondary | ICD-10-CM | POA: Insufficient documentation

## 2023-12-05 DIAGNOSIS — Z87891 Personal history of nicotine dependence: Secondary | ICD-10-CM | POA: Insufficient documentation

## 2023-12-05 DIAGNOSIS — J209 Acute bronchitis, unspecified: Secondary | ICD-10-CM | POA: Insufficient documentation

## 2023-12-05 DIAGNOSIS — Z1152 Encounter for screening for COVID-19: Secondary | ICD-10-CM | POA: Insufficient documentation

## 2023-12-05 LAB — CBC WITH DIFFERENTIAL/PLATELET
Abs Immature Granulocytes: 0.02 10*3/uL (ref 0.00–0.07)
Basophils Absolute: 0 10*3/uL (ref 0.0–0.1)
Basophils Relative: 0 %
Eosinophils Absolute: 0.1 10*3/uL (ref 0.0–0.5)
Eosinophils Relative: 1 %
HCT: 36.4 % (ref 36.0–46.0)
Hemoglobin: 11.9 g/dL — ABNORMAL LOW (ref 12.0–15.0)
Immature Granulocytes: 0 %
Lymphocytes Relative: 27 %
Lymphs Abs: 2.2 10*3/uL (ref 0.7–4.0)
MCH: 30.1 pg (ref 26.0–34.0)
MCHC: 32.7 g/dL (ref 30.0–36.0)
MCV: 92.2 fL (ref 80.0–100.0)
Monocytes Absolute: 0.8 10*3/uL (ref 0.1–1.0)
Monocytes Relative: 10 %
Neutro Abs: 5 10*3/uL (ref 1.7–7.7)
Neutrophils Relative %: 62 %
Platelets: 235 10*3/uL (ref 150–400)
RBC: 3.95 MIL/uL (ref 3.87–5.11)
RDW: 14.1 % (ref 11.5–15.5)
WBC: 8.1 10*3/uL (ref 4.0–10.5)
nRBC: 0 % (ref 0.0–0.2)

## 2023-12-05 LAB — BASIC METABOLIC PANEL
Anion gap: 11 (ref 5–15)
BUN: 14 mg/dL (ref 6–20)
CO2: 24 mmol/L (ref 22–32)
Calcium: 8.8 mg/dL — ABNORMAL LOW (ref 8.9–10.3)
Chloride: 104 mmol/L (ref 98–111)
Creatinine, Ser: 0.65 mg/dL (ref 0.44–1.00)
GFR, Estimated: >60 mL/min (ref >60)
Glucose, Bld: 98 mg/dL (ref 70–99)
Potassium: 4 mmol/L (ref 3.5–5.1)
Sodium: 139 mmol/L (ref 135–145)

## 2023-12-05 LAB — SARS CORONAVIRUS 2 BY RT PCR: SARS Coronavirus 2 by RT PCR: NEGATIVE

## 2023-12-05 MED ORDER — AZITHROMYCIN 250 MG PO TABS: ORAL_TABLET | ORAL | 0 refills | Status: AC

## 2023-12-05 MED ORDER — ALBUTEROL SULFATE HFA 108 (90 BASE) MCG/ACT IN AERS
2.0000 | INHALATION_SPRAY | Freq: Four times a day (QID) | RESPIRATORY_TRACT | 2 refills | Status: AC | PRN
Start: 2023-12-05 — End: ?

## 2023-12-05 MED ORDER — AEROCHAMBER MV MISC: 0 refills | Status: AC

## 2023-12-05 MED ORDER — PREDNISONE 10 MG (21) PO TBPK
ORAL_TABLET | ORAL | 0 refills | Status: AC
Start: 2023-12-05 — End: ?

## 2023-12-05 NOTE — ED Provider Notes (Signed)
[facility] Provider Note   Event Date/Time   First MD Initiated Contact with Patient 12/05/23 1333     (approximate) History  Shortness of Breath  HPI Jennifer Gomez is a 53 y.o. female with stated past medical history of tobacco abuse who presents complaining of cough and exertional shortness of breath that is been worsening over the last week.  Patient endorses subjective fevers over this time as well.  Patient denies any recent travel or sick contacts.  Patient denies any history of asthma or COPD ROS: Patient currently denies any vision changes, tinnitus, difficulty speaking, facial droop, sore throat, chest pain, abdominal pain, nausea/vomiting/diarrhea, dysuria, or weakness/numbness/paresthesias in any extremity   Physical Exam  Triage Vital Signs: ED Triage Vitals  Encounter Vitals Group     BP 12/05/23 1214 (!) 151/94     Systolic BP Percentile --      Diastolic BP Percentile --      Pulse Rate 12/05/23 1214 83     Resp 12/05/23 1214 16     Temp 12/05/23 1214 98.5 F (36.9 C)     Temp Source 12/05/23 1214 Oral     SpO2 12/05/23 1214 96 %     Weight 12/05/23 1215 140 lb (63.5 kg)     Height 12/05/23 1215 5\' 4"  (1.626 m)     Head Circumference --      Peak Flow --      Pain Score 12/05/23 1215 0     Pain Loc --      Pain Education --      Exclude from Growth Chart --    Most recent vital signs: Vitals:   12/05/23 1214 12/05/23 1405  BP: (!) 151/94   Pulse: 83 72  Resp: 16   Temp: 98.5 F (36.9 C)   SpO2: 96% 96%   General: Awake, oriented x4. CV:  Good peripheral perfusion.  Resp:  Normal effort.  Inspiratory and expiratory wheezing over bilateral lung fields with associated rhonchi Abd:  No distention.  Other:  Middle-aged well-developed, well-nourished Caucasian female resting comfortably in no acute distress ED Results / Procedures / Treatments  Labs (all labs ordered are listed, but only abnormal results are displayed) Labs  Reviewed  CBC WITH DIFFERENTIAL/PLATELET - Abnormal; Notable for the following components:      Result Value   Hemoglobin 11.9 (*)    All other components within normal limits  BASIC METABOLIC PANEL - Abnormal; Notable for the following components:   Calcium 8.8 (*)    All other components within normal limits  SARS CORONAVIRUS 2 BY RT PCR   EKG ED ECG REPORT I, Merwyn Katos, the attending physician, personally viewed and interpreted this ECG. Date: 12/05/2023 EKG Time: 1231 Rate: 85 Rhythm: normal sinus rhythm QRS Axis: normal Intervals: Right bundle branch block ST/T Wave abnormalities: normal Narrative Interpretation: RBBB with normal sinus rhythm.  No evidence of acute ischemia RADIOLOGY ED MD interpretation: 2 view chest x-ray interpreted by me shows no evidence of acute abnormalities including no pneumonia, pneumothorax, or widened mediastinum -Agree with radiology assessment Official radiology report(s): DG Chest 2 View Result Date: 12/05/2023 CLINICAL DATA:  Shortness of breath. Cold symptoms over last few weeks. EXAM: CHEST - 2 VIEW COMPARISON:  X-ray 05/26/2022 FINDINGS: Hyperinflation. No consolidation, pneumothorax or effusion. No edema. Normal cardiopericardial silhouette. IMPRESSION: No acute cardiopulmonary disease. Electronically Signed   By: Karen Kays M.D.   On: 12/05/2023 13:26   PROCEDURES: Critical Care  performed: No Procedures MEDICATIONS ORDERED IN ED: Medications - No data to display IMPRESSION / MDM / ASSESSMENT AND PLAN / ED COURSE  I reviewed the triage vital signs and the nursing notes.                             The patient is on the cardiac monitor to evaluate for evidence of arrhythmia and/or significant heart rate changes. Patient's presentation is most consistent with acute presentation with potential threat to life or bodily function. The patient appears to be suffering from bronchitis with wheezing.  Given patient's significant tobacco  abuse history, there is a possibility of COPD exacerbation.  However, patient does not have known history  Based on the history, exam, CXR/EKG, and further workup I don't suspect any other emergent cause of this presentation, such as pneumonia, acute coronary syndrome, congestive heart failure, pulmonary embolism, or pneumothorax.  Rx: Steroids, Antibiotics, Albuterol Disposition: Discharge home with SRP. PCP follow up recommended in next 48hours.   FINAL CLINICAL IMPRESSION(S) / ED DIAGNOSES   Final diagnoses:  Bronchitis with acute wheezing   Rx / DC Orders   ED Discharge Orders          Ordered    albuterol (VENTOLIN HFA) 108 (90 Base) MCG/ACT inhaler  Every 6 hours PRN        12/05/23 1354    Spacer/Aero-Holding Chambers (AEROCHAMBER MV) inhaler        12/05/23 1354    predniSONE (STERAPRED UNI-PAK 21 TAB) 10 MG (21) TBPK tablet        12/05/23 1354    azithromycin (ZITHROMAX Z-PAK) 250 MG tablet        12/05/23 1354           Note:  This document was prepared using Dragon voice recognition software and may include unintentional dictation errors.   Merwyn Katos, MD 12/05/23 [phone]

## 2023-12-05 NOTE — ED Triage Notes (Signed)
Pt to ED via POV for shortness of breath. Pt states that she has had cold symptoms and that for the last few weeks when she walks she is very short of breath. Pt is concerned that her blood levels may be low. Pt reports that she has hx/o anemia requiring blood transfusions. Pt is currently in NAD.

## 2023-12-05 NOTE — ED Provider Triage Note (Signed)
Emergency Medicine Provider Triage Evaluation Note  Jennifer Gomez , a 53 y.o. female  was evaluated in triage.  Pt complains of SOB.  Cold sx prior and now SOB with exertion.  + smoker but not able to now due to SOB  Review of Systems  Positive: SOB, cough, congestion Negative:   Physical Exam  BP (!) 151/94 (BP Location: Left Arm)   Pulse 83   Temp 98.5 F (36.9 C) (Oral)   Resp 16   Ht 5\' 4"  (1.626 m)   Wt 63.5 kg   SpO2 96%   BMI 24.03 kg/m  Gen:   Awake, no distress   Resp:  Normal effort  MSK:   Moves extremities without difficulty  Other:    Medical Decision Making  Medically screening exam initiated at 12:18 PM.  Appropriate orders placed.  Jennifer Gomez was informed that the remainder of the evaluation will be completed by another provider, this initial triage assessment does not replace that evaluation, and the importance of remaining in the ED until their evaluation is complete.     Tommi Rumps, PA-C 12/05/23 1221

## 2023-12-20 ENCOUNTER — Emergency Department: Payer: Self-pay

## 2023-12-20 ENCOUNTER — Other Ambulatory Visit: Payer: Self-pay

## 2023-12-20 ENCOUNTER — Emergency Department
Admission: EM | Admit: 2023-12-20 | Discharge: 2023-12-20 | Disposition: A | Discharge status: Home / Self Care | Payer: Self-pay | Attending: Emergency Medicine | Admitting: Emergency Medicine

## 2023-12-20 DIAGNOSIS — R197 Diarrhea, unspecified: Secondary | ICD-10-CM | POA: Insufficient documentation

## 2023-12-20 DIAGNOSIS — Z20822 Contact with and (suspected) exposure to covid-19: Secondary | ICD-10-CM | POA: Insufficient documentation

## 2023-12-20 DIAGNOSIS — R1031 Right lower quadrant pain: Secondary | ICD-10-CM | POA: Insufficient documentation

## 2023-12-20 DIAGNOSIS — R1011 Right upper quadrant pain: Secondary | ICD-10-CM | POA: Insufficient documentation

## 2023-12-20 LAB — CBC
HCT: 34.1 % — ABNORMAL LOW (ref 36.0–46.0)
Hemoglobin: 10.9 g/dL — ABNORMAL LOW (ref 12.0–15.0)
MCH: 29.8 pg (ref 26.0–34.0)
MCHC: 32 g/dL (ref 30.0–36.0)
MCV: 93.2 fL (ref 80.0–100.0)
Platelets: 283 10*3/uL (ref 150–400)
RBC: 3.66 MIL/uL — ABNORMAL LOW (ref 3.87–5.11)
RDW: 14.3 % (ref 11.5–15.5)
WBC: 6.6 10*3/uL (ref 4.0–10.5)
nRBC: 0 % (ref 0.0–0.2)

## 2023-12-20 LAB — PROTIME-INR
INR: 1 (ref 0.8–1.2)
Prothrombin Time: 13.7 s (ref 11.4–15.2)

## 2023-12-20 LAB — RESP PANEL BY RT-PCR (RSV, FLU A&B, COVID)  RVPGX2
Influenza A by PCR: NEGATIVE
Influenza B by PCR: NEGATIVE
Resp Syncytial Virus by PCR: NEGATIVE
SARS Coronavirus 2 by RT PCR: NEGATIVE

## 2023-12-20 LAB — COMPREHENSIVE METABOLIC PANEL
ALT: 12 U/L (ref 0–44)
AST: 14 U/L — ABNORMAL LOW (ref 15–41)
Albumin: 3 g/dL — ABNORMAL LOW (ref 3.5–5.0)
Alkaline Phosphatase: 76 U/L (ref 38–126)
Anion gap: 11 (ref 5–15)
BUN: 15 mg/dL (ref 6–20)
CO2: 23 mmol/L (ref 22–32)
Calcium: 9 mg/dL (ref 8.9–10.3)
Chloride: 101 mmol/L (ref 98–111)
Creatinine, Ser: 0.65 mg/dL (ref 0.44–1.00)
GFR, Estimated: >60 mL/min (ref >60)
Glucose, Bld: 88 mg/dL (ref 70–99)
Potassium: 4 mmol/L (ref 3.5–5.1)
Sodium: 135 mmol/L (ref 135–145)
Total Bilirubin: 0.4 mg/dL (ref 0.0–1.2)
Total Protein: 7 g/dL (ref 6.5–8.1)

## 2023-12-20 MED ORDER — IOHEXOL 300 MG/ML  SOLN
100.0000 mL | Freq: Once | INTRAMUSCULAR | Status: AC | PRN
  Administered 2023-12-20: 100 mL via INTRAVENOUS

## 2023-12-20 NOTE — ED Triage Notes (Signed)
Pt sts that she has been having abd pain with n/v and dark diarrhea. Pt sts that it has been going on all week.

## 2023-12-20 NOTE — ED Notes (Signed)
Pt verbalized understanding of discharge instructions. Opportunity for questions provided.

## 2023-12-20 NOTE — ED Provider Notes (Signed)
[place] [facility] Provider Note   CSN: [id number] Arrival date & time: 12/20/23  1035     History  Chief Complaint  Patient presents with   Abdominal Pain    Jennifer Gomez is a 53 y.o. female.  Patient here for abdominal pain and diarrhea.  Symptoms for a little over 1 week.  Abdominal pain is mostly right sided.  Admits to fever up to 102 at home.  No nausea or vomiting.  Seen at primary care 6 days ago started on Augmentin without any improvement.  No prior abdominal surgical history.  No known ill contacts.   Abdominal Pain Associated symptoms: chills, cough, diarrhea, fatigue and fever   Associated symptoms: no chest pain, no nausea and no vomiting        Home Medications Prior to Admission medications   Medication Sig Start Date End Date Taking? Authorizing Provider  albuterol (VENTOLIN HFA) 108 (90 Base) MCG/ACT inhaler Inhale 2 puffs into the lungs every 6 (six) hours as needed for wheezing or shortness of breath. 12/05/23   Merwyn Katos, MD  aspirin 81 MG EC tablet Take 81 mg by mouth daily. Swallow whole.    [provider]  Aspirin-Salicylamide-Caffeine (BC HEADACHE POWDER PO) Take 1 packet by mouth as needed (pain).     [provider]  Black Cohosh 160 MG CAPS Take 160 mg by mouth daily.    [provider]  ibuprofen (ADVIL) 200 MG tablet Take 200 mg by mouth every 6 (six) hours as needed for mild pain.     [provider]  methimazole (TAPAZOLE) 5 MG tablet Take 7.5 mg by mouth daily.    [provider]  predniSONE (STERAPRED UNI-PAK 21 TAB) 10 MG (21) TBPK tablet As directed on packaging 12/05/23   Merwyn Katos, MD  Spacer/Aero-Holding Chambers (AEROCHAMBER MV) inhaler Use as instructed 12/05/23   Merwyn Katos, MD      Allergies    Patient has no known allergies.    Review of Systems   Review of Systems  Constitutional:  Positive for chills, fatigue and fever.  HENT:   Negative for congestion.   Respiratory:  Positive for cough.   Cardiovascular:  Negative for chest pain.  Gastrointestinal:  Positive for abdominal pain and diarrhea. Negative for nausea and vomiting.  Neurological:  Negative for seizures, weakness and numbness.    Physical Exam Updated Vital Signs BP 103/60   Pulse 78   Temp 97.7 F (36.5 C) (Oral)   Resp 18   Ht 5\' 4"  (1.626 m)   Wt 65.8 kg   SpO2 100%   BMI 24.89 kg/m  Physical Exam Vitals and nursing note reviewed.  Constitutional:      General: She is not in acute distress.    Appearance: She is well-developed.  HENT:     Head: Normocephalic and atraumatic.  Eyes:     Conjunctiva/sclera: Conjunctivae normal.  Cardiovascular:     Rate and Rhythm: Normal rate and regular rhythm.  Pulmonary:     Effort: Pulmonary effort is normal. No respiratory distress.     Breath sounds: Normal breath sounds.  Abdominal:     Palpations: Abdomen is soft.     Tenderness: There is abdominal tenderness in the right upper quadrant and right lower quadrant.  Musculoskeletal:        General: No swelling.     Cervical back: Neck supple.  Skin:    General: Skin is  warm and dry.     Capillary Refill: Capillary refill takes less than 2 seconds.  Neurological:     Mental Status: She is alert.  Psychiatric:        Mood and Affect: Mood normal.     ED Results / Procedures / Treatments   Labs (all labs ordered are listed, but only abnormal results are displayed) Labs Reviewed  COMPREHENSIVE METABOLIC PANEL - Abnormal; Notable for the following components:      Result Value   Albumin 3.0 (*)    AST 14 (*)    All other components within normal limits  CBC - Abnormal; Notable for the following components:   RBC 3.66 (*)    Hemoglobin 10.9 (*)    HCT 34.1 (*)    All other components within normal limits  RESP PANEL BY RT-PCR (RSV, FLU A&B, COVID)  [id number]  PROTIME-INR  PREGNANCY, URINE  POC OCCULT BLOOD, ED  TYPE AND SCREEN  TYPE  AND SCREEN    EKG None  Radiology CT ABDOMEN PELVIS W CONTRAST Result Date: 12/20/2023 CLINICAL DATA:  Abdominal pain with nausea, vomiting, and dark-colored diarrhea EXAM: CT ABDOMEN AND PELVIS WITH CONTRAST TECHNIQUE: Multidetector CT imaging of the abdomen and pelvis was performed using the standard protocol following bolus administration of intravenous contrast. RADIATION DOSE REDUCTION: This exam was performed according to the departmental dose-optimization program which includes automated exposure control, adjustment of the mA and/or kV according to patient size and/or use of iterative reconstruction technique. CONTRAST:  OMNIPAQUE IOHEXOL 300 MG/ML  SOLN COMPARISON:  CT abdomen and pelvis dated 11/25/2022 FINDINGS: Lower chest: No focal consolidation or pulmonary nodule in the lung bases. No pleural effusion or pneumothorax demonstrated. Partially imaged heart size is normal. Hepatobiliary: No focal hepatic lesions. No intra or extrahepatic biliary ductal dilation. Normal gallbladder. Pancreas: No focal lesions or main ductal dilation. Spleen: Normal in size without focal abnormality. Adrenals/Urinary Tract: No adrenal nodules. No suspicious renal mass, calculi or hydronephrosis. No focal bladder wall thickening. Stomach/Bowel: Normal appearance of the stomach. No evidence of bowel wall thickening, distention, or inflammatory changes. Normal appendix. Vascular/Lymphatic: Aortic atherosclerosis. Prominent cross pelvic collaterals. No enlarged abdominal or pelvic lymph nodes. Reproductive: No adnexal masses. Other: No free fluid, fluid collection, or free air. Musculoskeletal: No acute or abnormal lytic or blastic osseous lesions. IMPRESSION: 1. No acute abdominopelvic findings. 2. Prominent cross pelvic collaterals, which can be seen in the setting of pelvic congestion syndrome. 3.  Aortic Atherosclerosis (ICD10-I70.0). Electronically Signed   By: Agustin Cree M.D.   On: 12/20/2023 13:16   DG  Chest 2 View Result Date: 12/20/2023 CLINICAL DATA:  Cough and fever EXAM: CHEST - 2 VIEW COMPARISON:  Chest radiograph dated 12/05/2023 FINDINGS: Normal lung volumes. No focal consolidations. No pleural effusion or pneumothorax. The heart size and mediastinal contours are within normal limits. No acute osseous abnormality. IMPRESSION: No active cardiopulmonary disease. Electronically Signed   By: Agustin Cree M.D.   On: 12/20/2023 13:04    Procedures Procedures    Medications Ordered in ED Medications  iohexol (OMNIPAQUE) 300 MG/ML solution 100 mL (100 mLs Intravenous Contrast Given 12/20/23 1236)    ED Course/ Medical Decision Making/ A&P                                 Medical Decision Making Patient here for abdominal pain with fever reported at home.  Notes dark stools  generalized fatigue also fever up to 102 not improved on Augmentin antibiotics.  CBC and CMP overall unremarkable.  CT was nonacute and chest x-ray is clear.  Signed out to attending Dr. Larinda Buttery for final disposition  Amount and/or Complexity of Data Reviewed Labs: ordered. Radiology: ordered.  Risk Prescription drug management.           Final Clinical Impression(s) / ED Diagnoses Final diagnoses:  Right lower quadrant abdominal pain  Diarrhea, unspecified type    Rx / DC Orders ED Discharge Orders     None         Christen Bame, [place] 12/20/23 1351    Chesley Noon, MD 12/20/23 1537

## 2023-12-20 NOTE — ED Provider Notes (Signed)
-----------------------------------------   1:30 PM on 12/20/2023 -----------------------------------------  Blood pressure 103/60, pulse 78, temperature 97.7 F (36.5 C), temperature source Oral, resp. rate 18, height 5\' 4"  (1.626 m), weight 65.8 kg, SpO2 100%.  Assuming care from [facility].  In short, Jennifer Gomez is a 53 y.o. female with a chief complaint of Abdominal Pain .  Refer to the original H&P for additional details.  The current plan of care is to follow-up CT imaging and labs.  ----------------------------------------- 1:39 PM on 12/20/2023 ----------------------------------------- CT imaging is negative for acute process, does show findings consistent with possible pelvic congestion syndrome.  With patient's diarrhea, would also consider a gastroenteritis.  Labs are reassuring with no significant anemia, leukocytosis, electrolyte abnormality, or AKI.  LFTs are unremarkable, no urinary symptoms to suggest UTI.  Patient has not had a menstrual period in 3 years, pregnancy testing not needed at this time.  She is feeling well on reassessment and is appropriate for discharge home with outpatient follow-up, referral provided to OB/GYN for possible pelvic congestion syndrome.  She was counseled to return to the ED for new or worsening symptoms.  Patient agrees with plan.         Chesley Noon, MD 12/20/23 1340
